# Patient Record
Sex: Female | Born: 1955
Health system: Southern US, Community
[De-identification: ages and names within clinical notes are randomized; demographics above are authoritative.]

---

## 2007-07-31 IMAGING — MG MM DIGITAL SCREENING BILAT
4 series · 4 of 4 positions shown · non-contrast
Comparison: none

DG SCREEN MAMMOGRAM BILATERAL
Bilateral CC and MLO view(s) were taken.

DIGITAL SCREENING MAMMOGRAM WITH CAD:
There are scattered fibroglandular densities.  There is no dominant mass, architectural distortion 
or calcification to suggest malignancy.

[R CC]
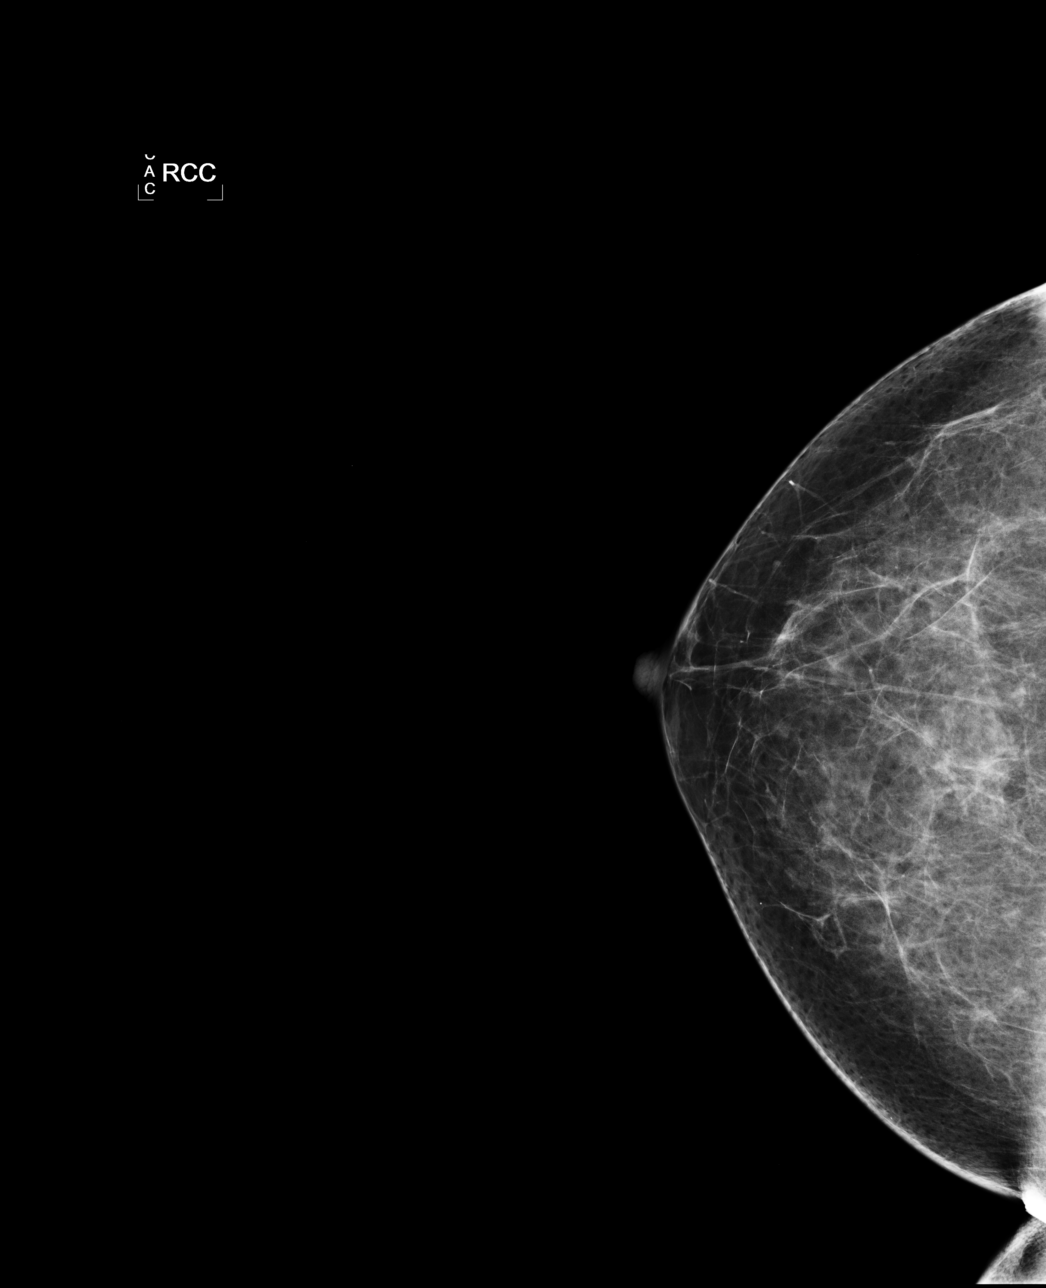

[R MLO]
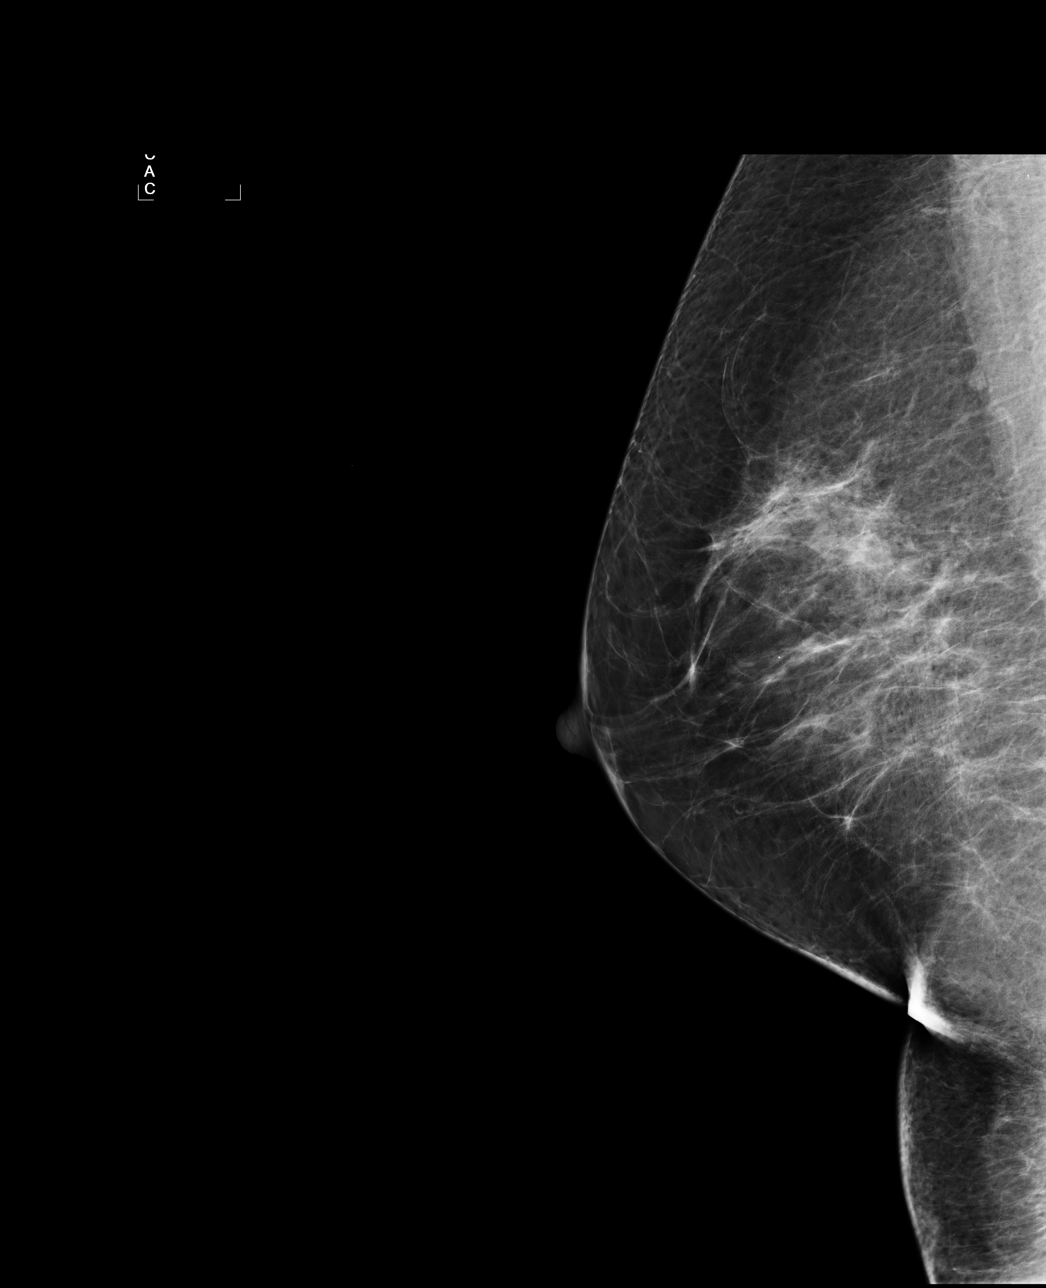

[L CC]
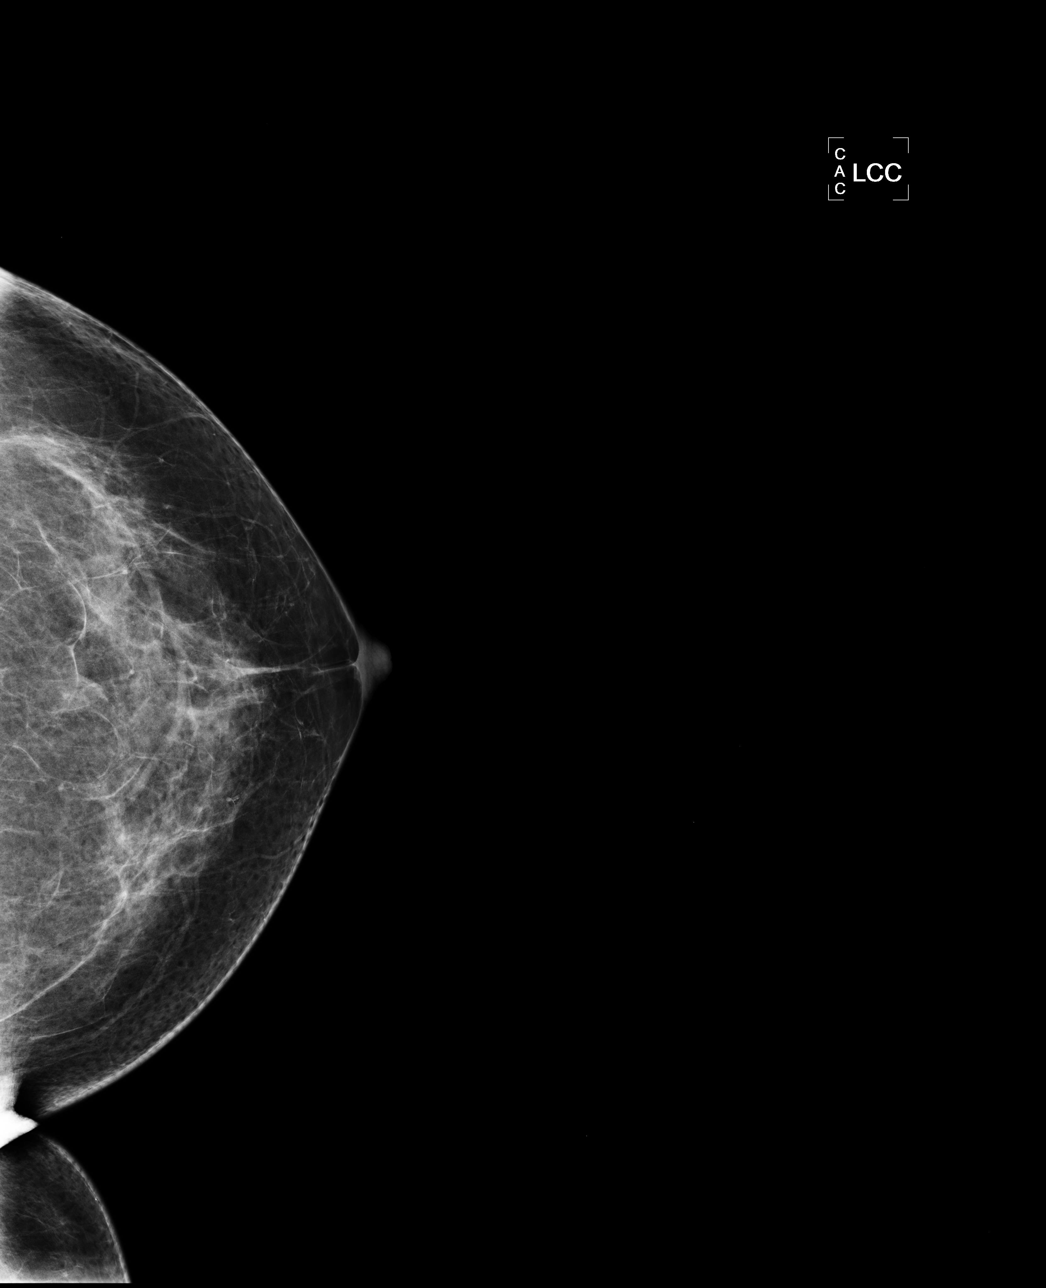

[L MLO]
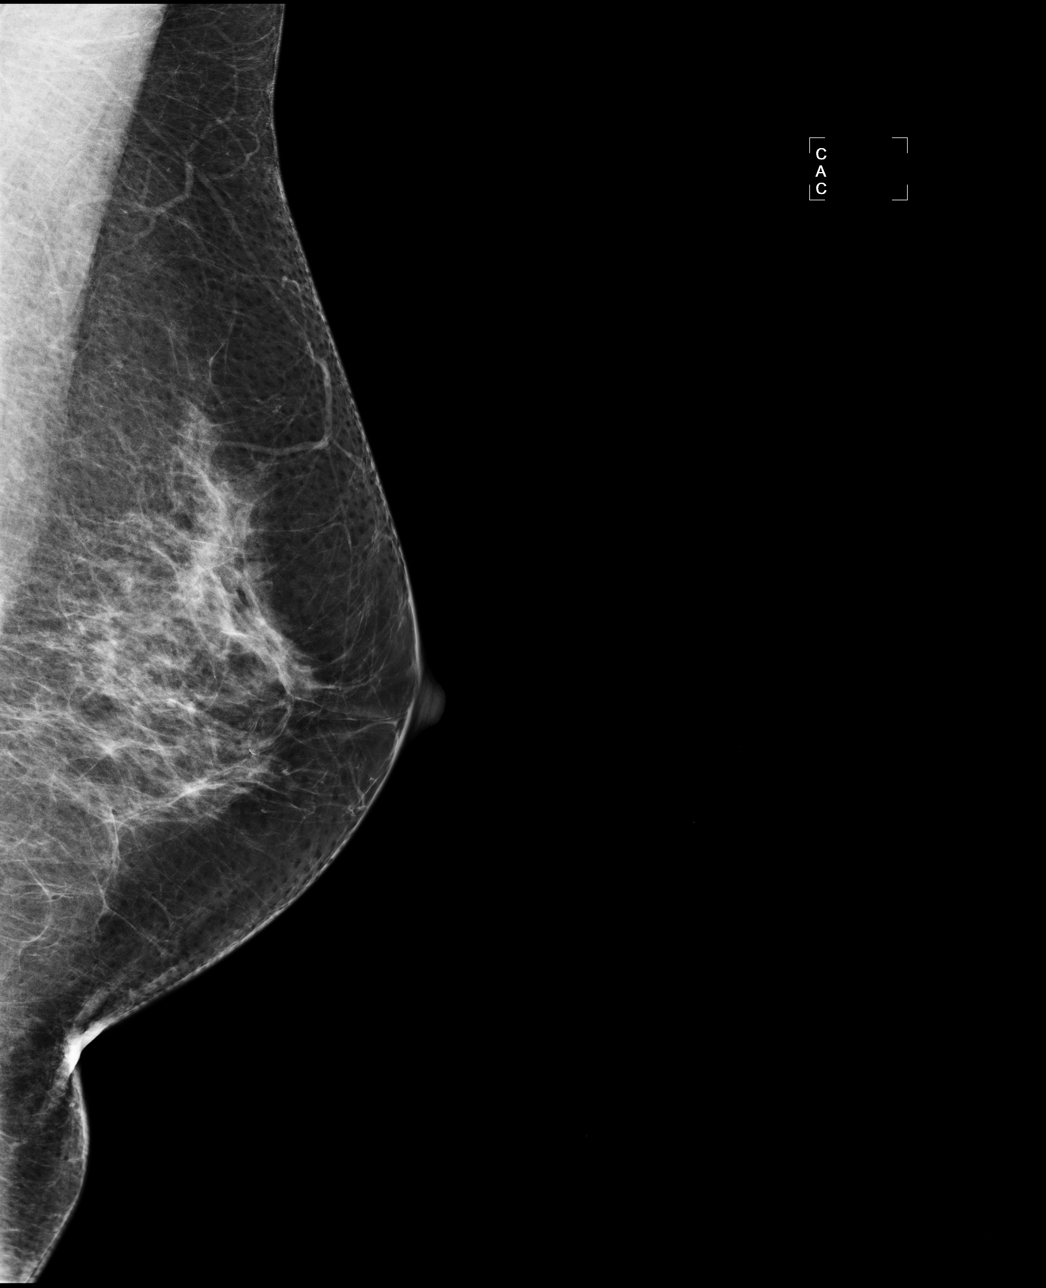

[4 of 4 positions shown; findings below may reference images not displayed]

IMPRESSION: No mammographic evidence of malignancy.  Suggest yearly screening mammography.

ASSESSMENT: Negative - BI-RADS 1

Screening mammogram in 1 year.
ANALYZED BY COMPUTER AIDED DETECTION. , THIS PROCEDURE WAS A DIGITAL MAMMOGRAM.

## 2008-03-20 IMAGING — CR DG CHEST 2V
2 series · 2 of 2 positions shown · non-contrast
Comparison: None

CLINICAL DATA: Flu symptoms, cough, fever.

CHEST - 2 VIEW

[w chest pa]
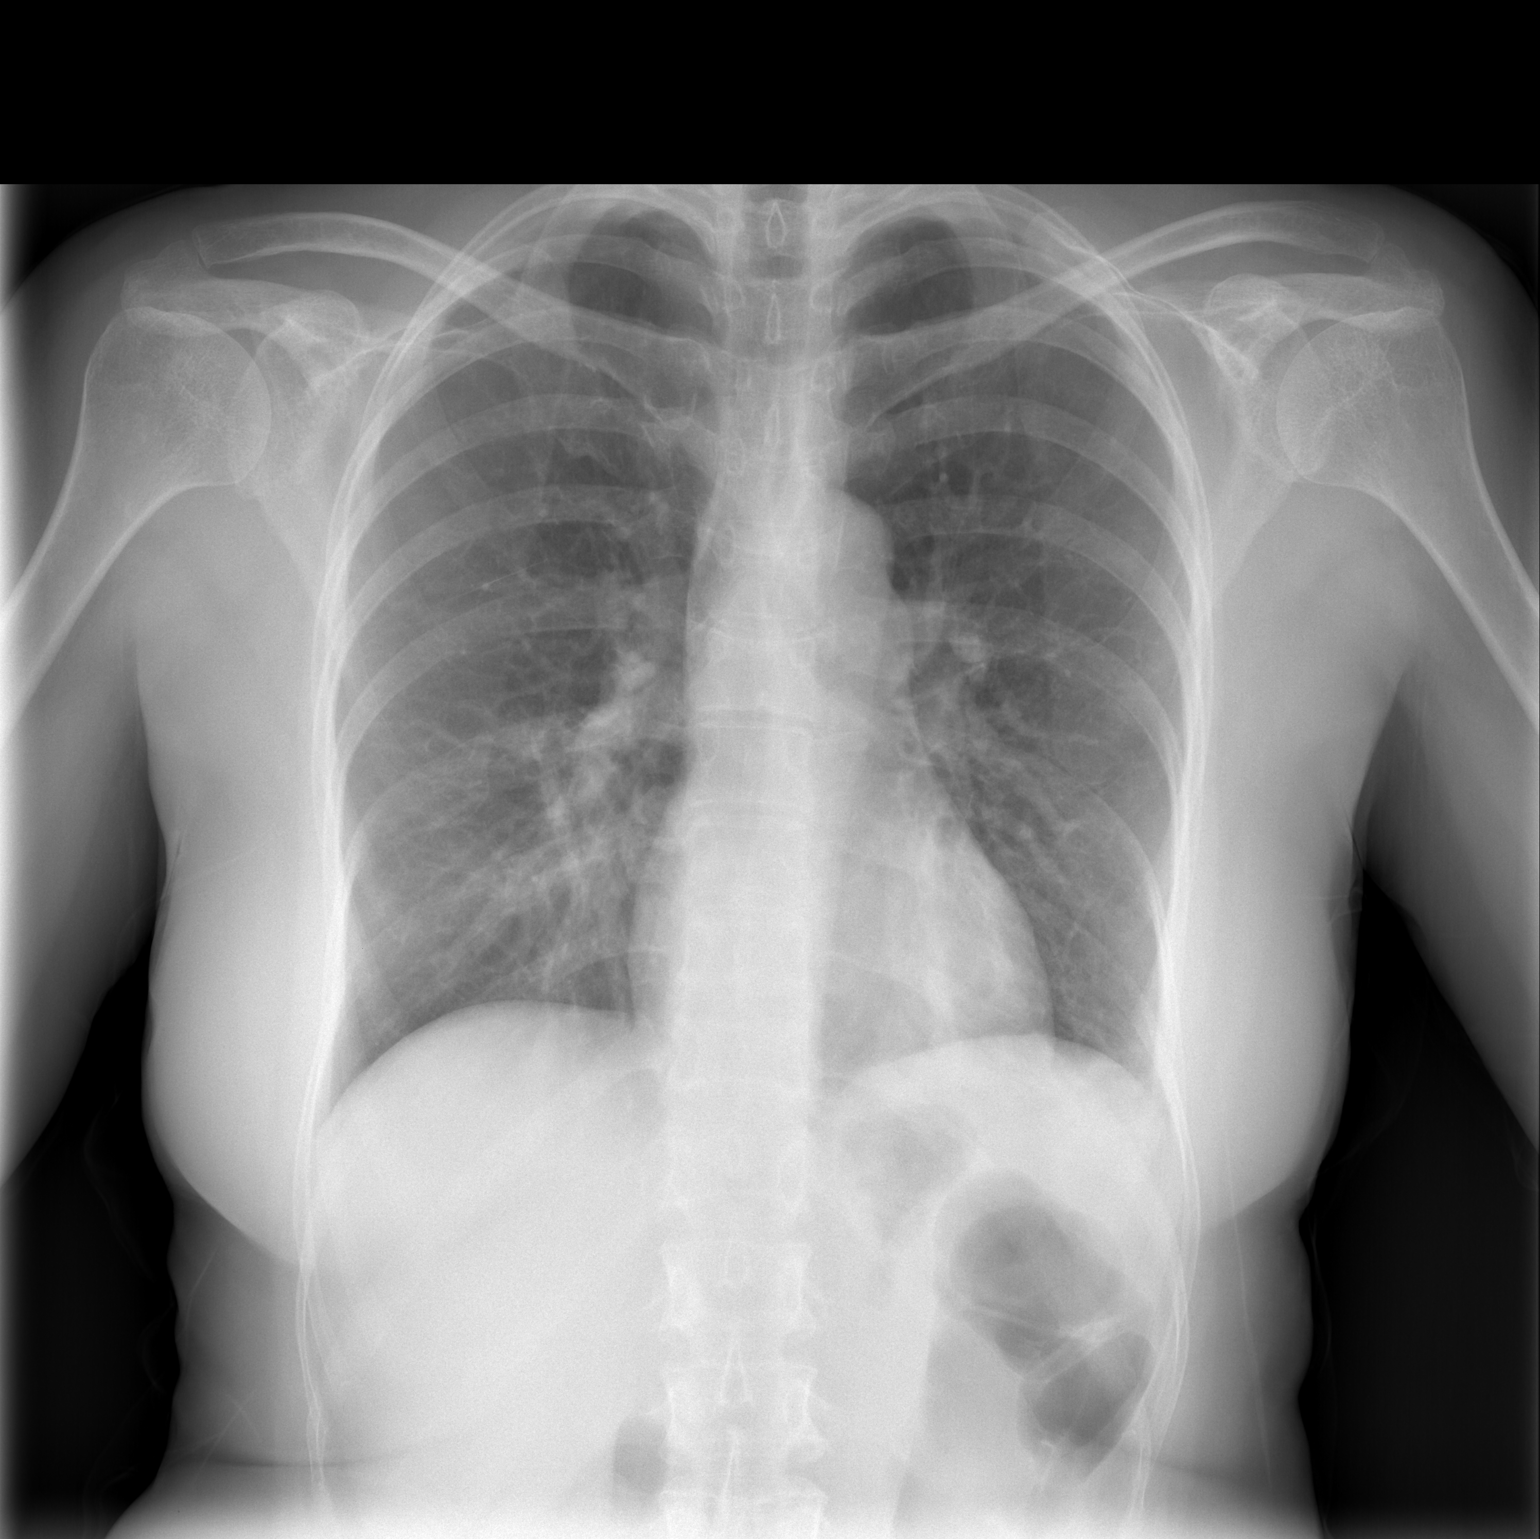

[w chest lat]
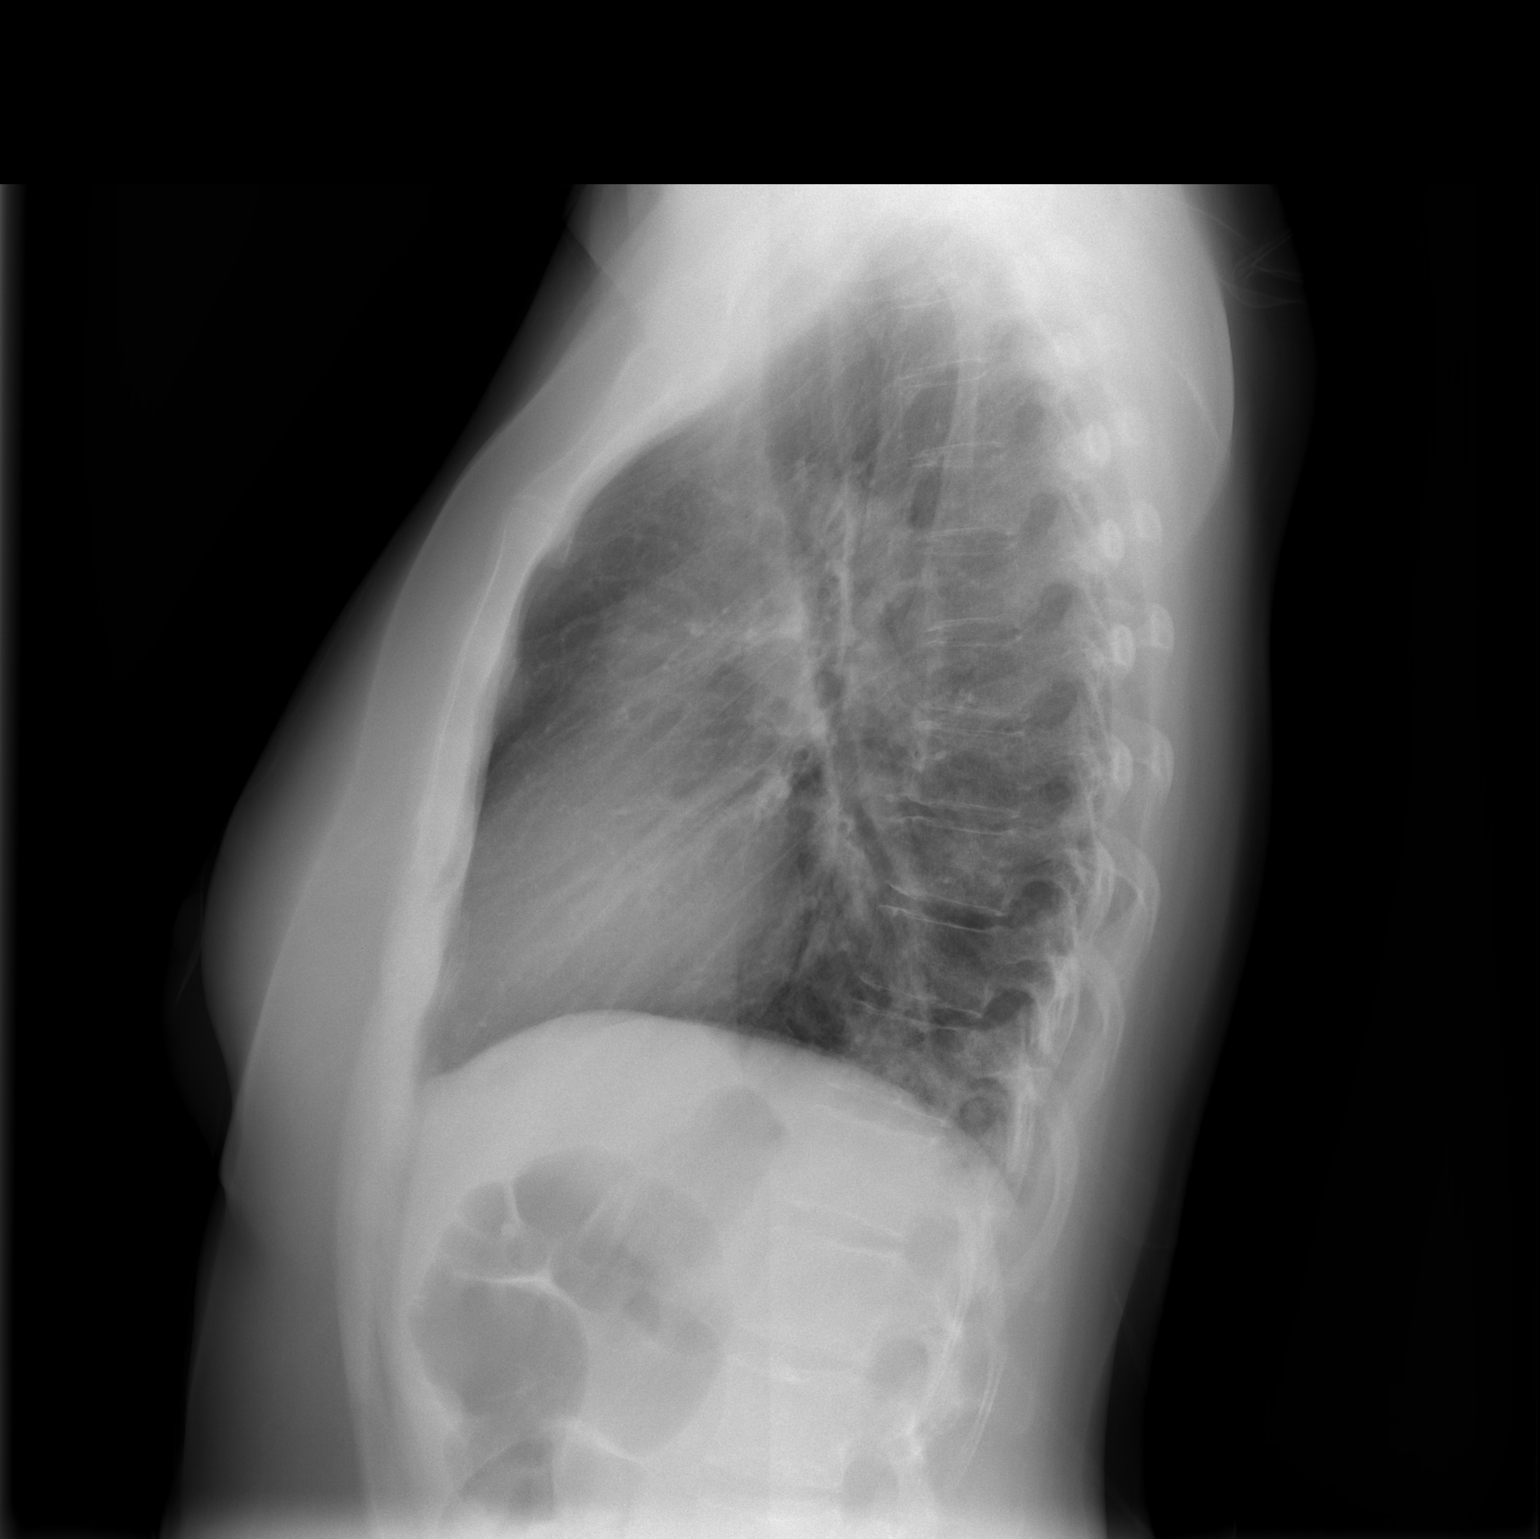

[2 of 2 positions shown; findings below may reference images not displayed]

FINDINGS: There is peribronchial thickening. Heart and mediastinal
contours are within normal limits.  No focal opacities or
effusions.  No acute bony abnormality.
IMPRESSION: Mild bronchitic changes.

## 2008-12-01 IMAGING — CR DG HIP W/ PELVIS BILAT
5 series · 5 of 5 positions shown · non-contrast
Comparison: None

CLINICAL DATA: History given of bilateral hip pain left more than
right.  History of osteopenia.

BILATERAL HIP WITH PELVIS - 4+ VIEW

[t pelvis a.p.]
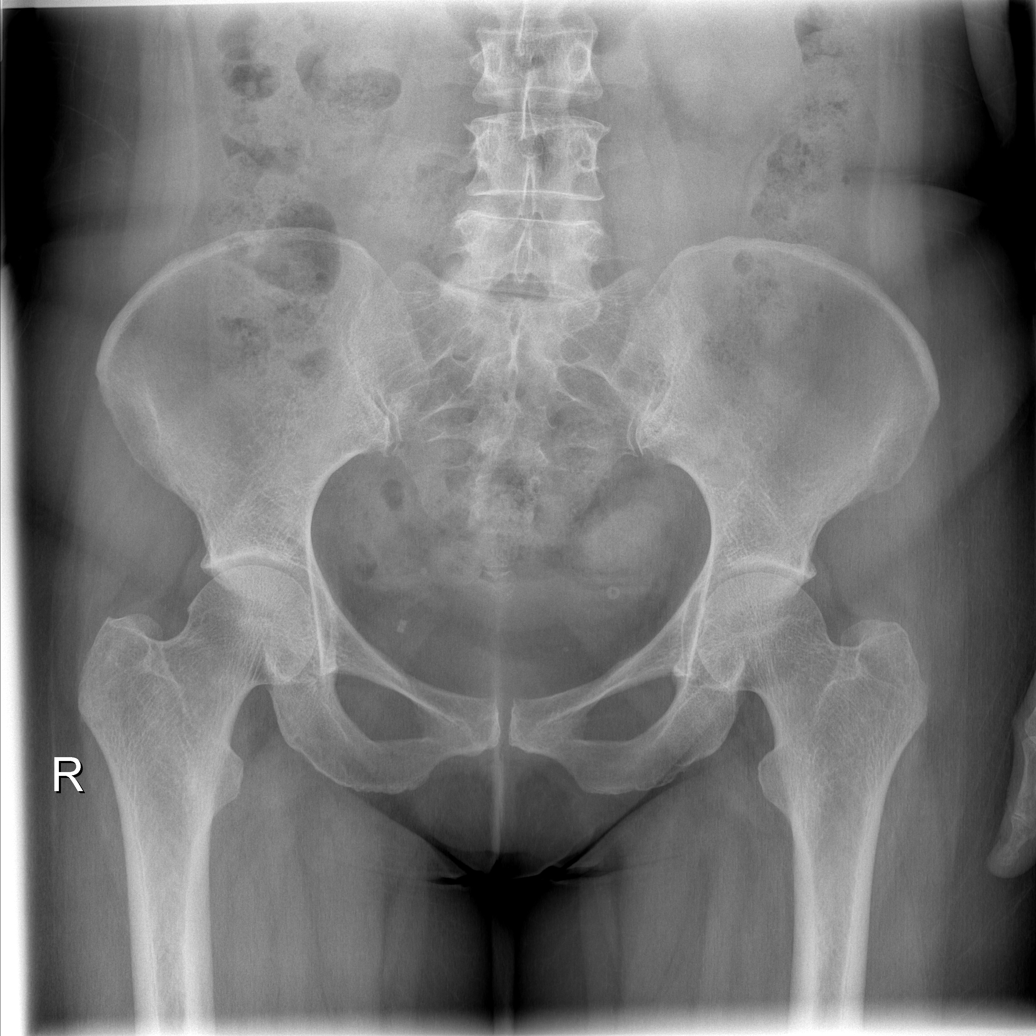

[t hip ap left]
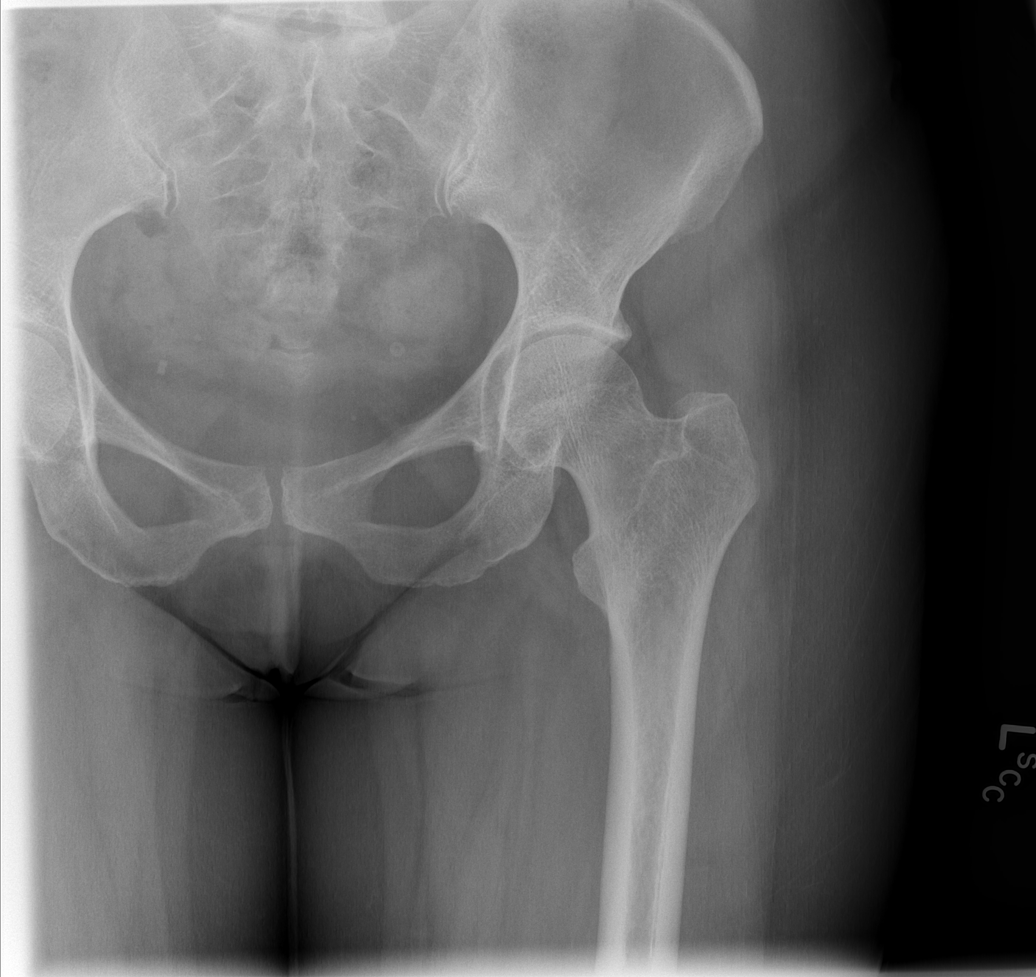

[t hip frog leg left]
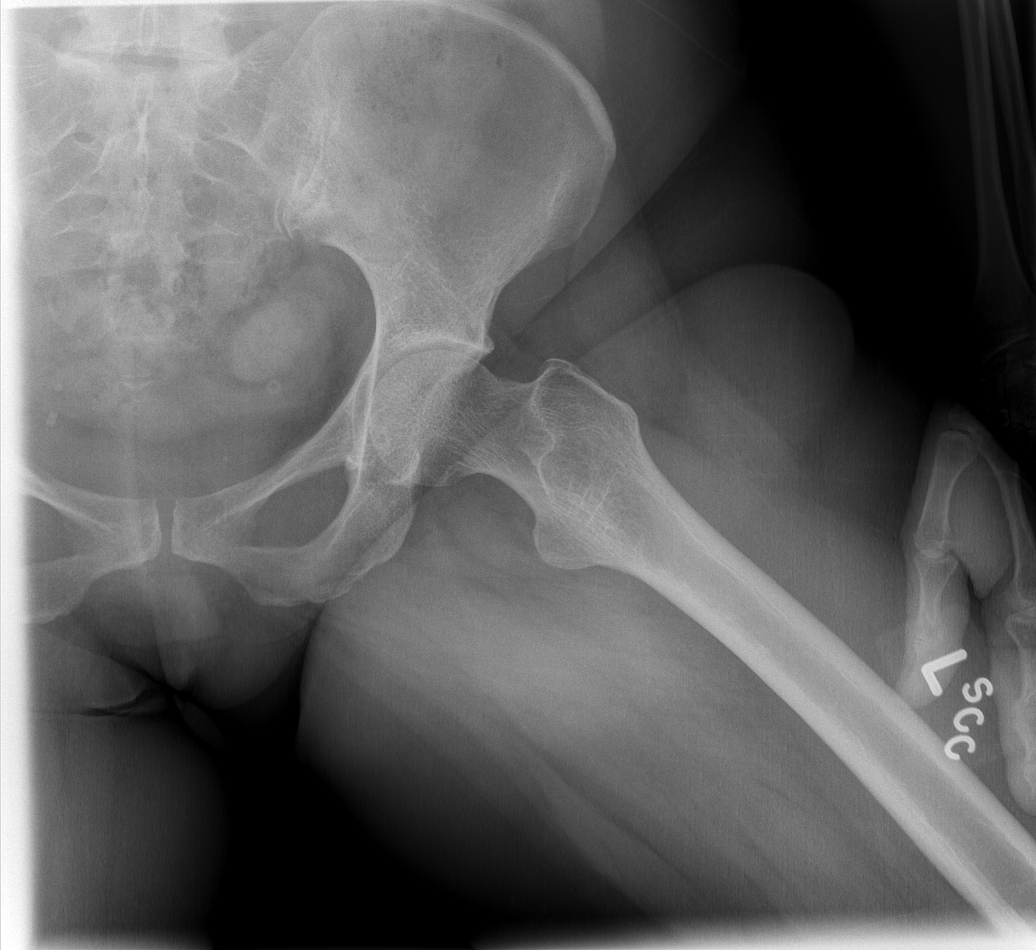

[t hip ap right]
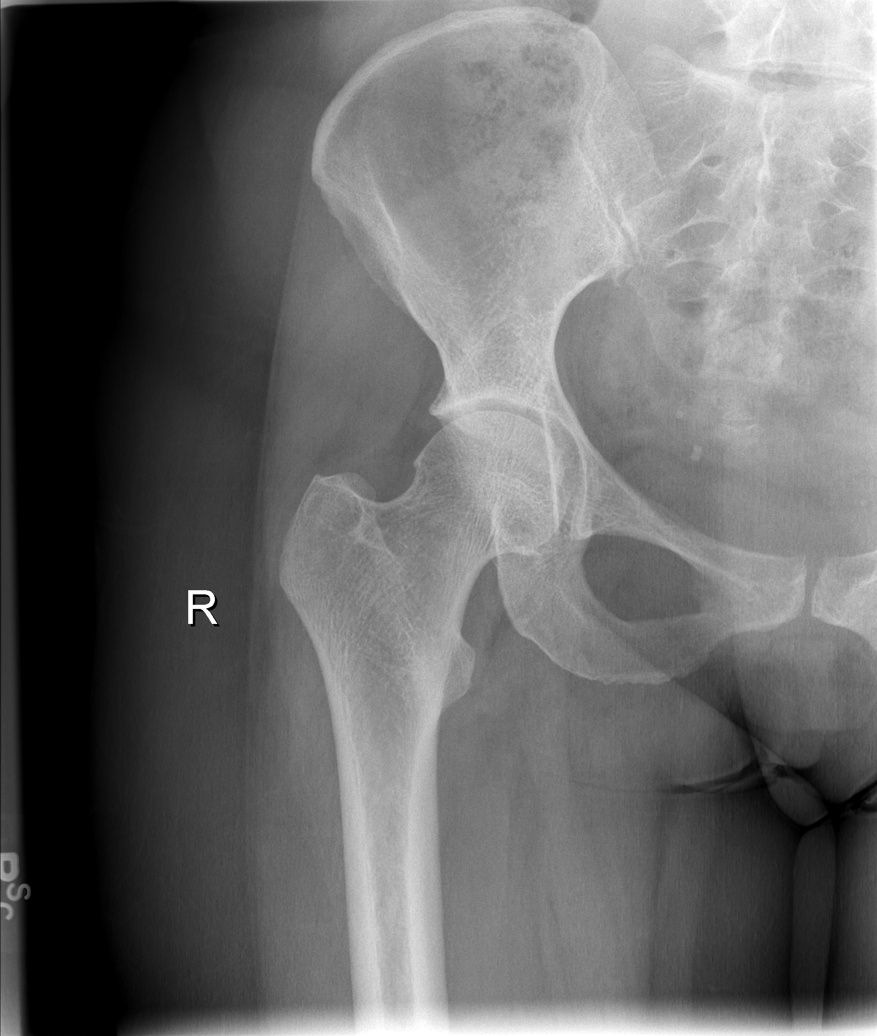

[t hip frog leg right]
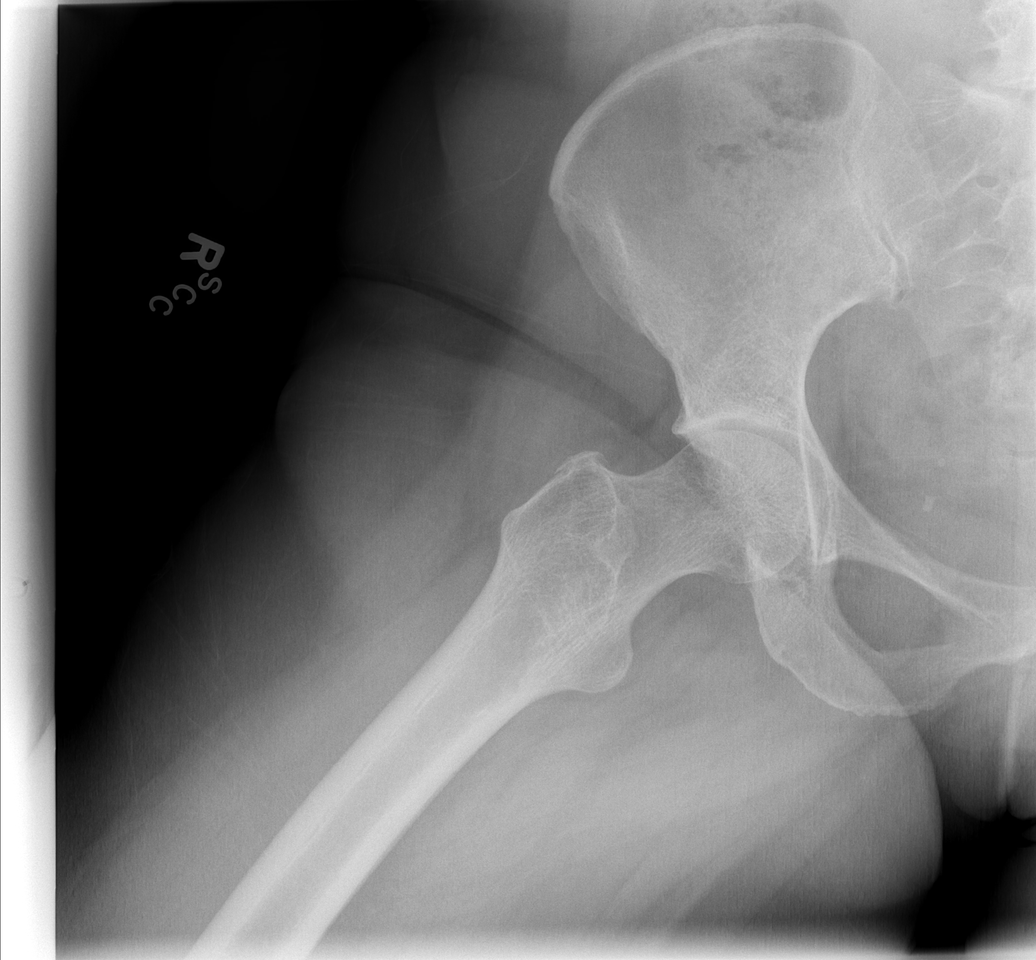

[5 of 5 positions shown; findings below may reference images not displayed]

FINDINGS: There is mild degenerative spondylosis compatible with
age. Tubal ligation clips are seen in the pelvis.  SI joints appear
intact.  Hip joint spaces are preserved.  No fracture, dislocation,
bony destruction, or calcific bursitis is identified.  There is
very slight minimal degenerative spurring of the right femoral
head.
IMPRESSION: There is mild degenerative spondylosis compatible with age. There
is very slight minimal degenerative spurring of right femoral head.
No other hip abnormality is identified.

## 2010-01-11 IMAGING — CR DG SHOULDER 2+V*L*
3 series · 3 of 3 positions shown · non-contrast
Comparison: None.

CLINICAL DATA: Left shoulder pain

LEFT SHOULDER - 2+ VIEW

[w shoulder ap internal left]
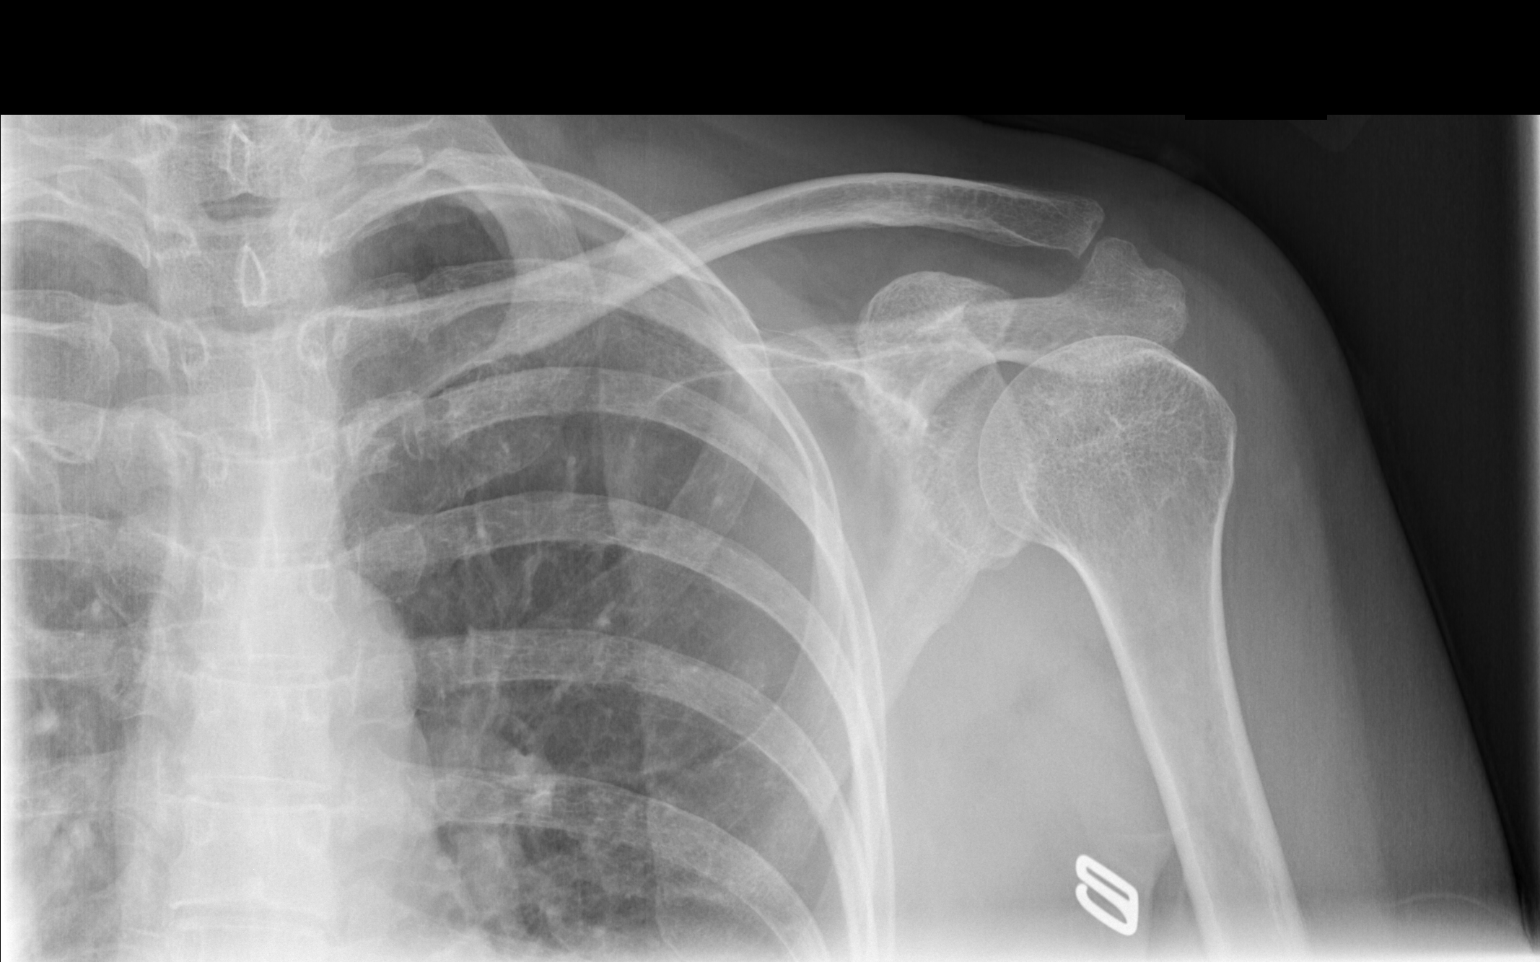

[w shoulder ap external left]
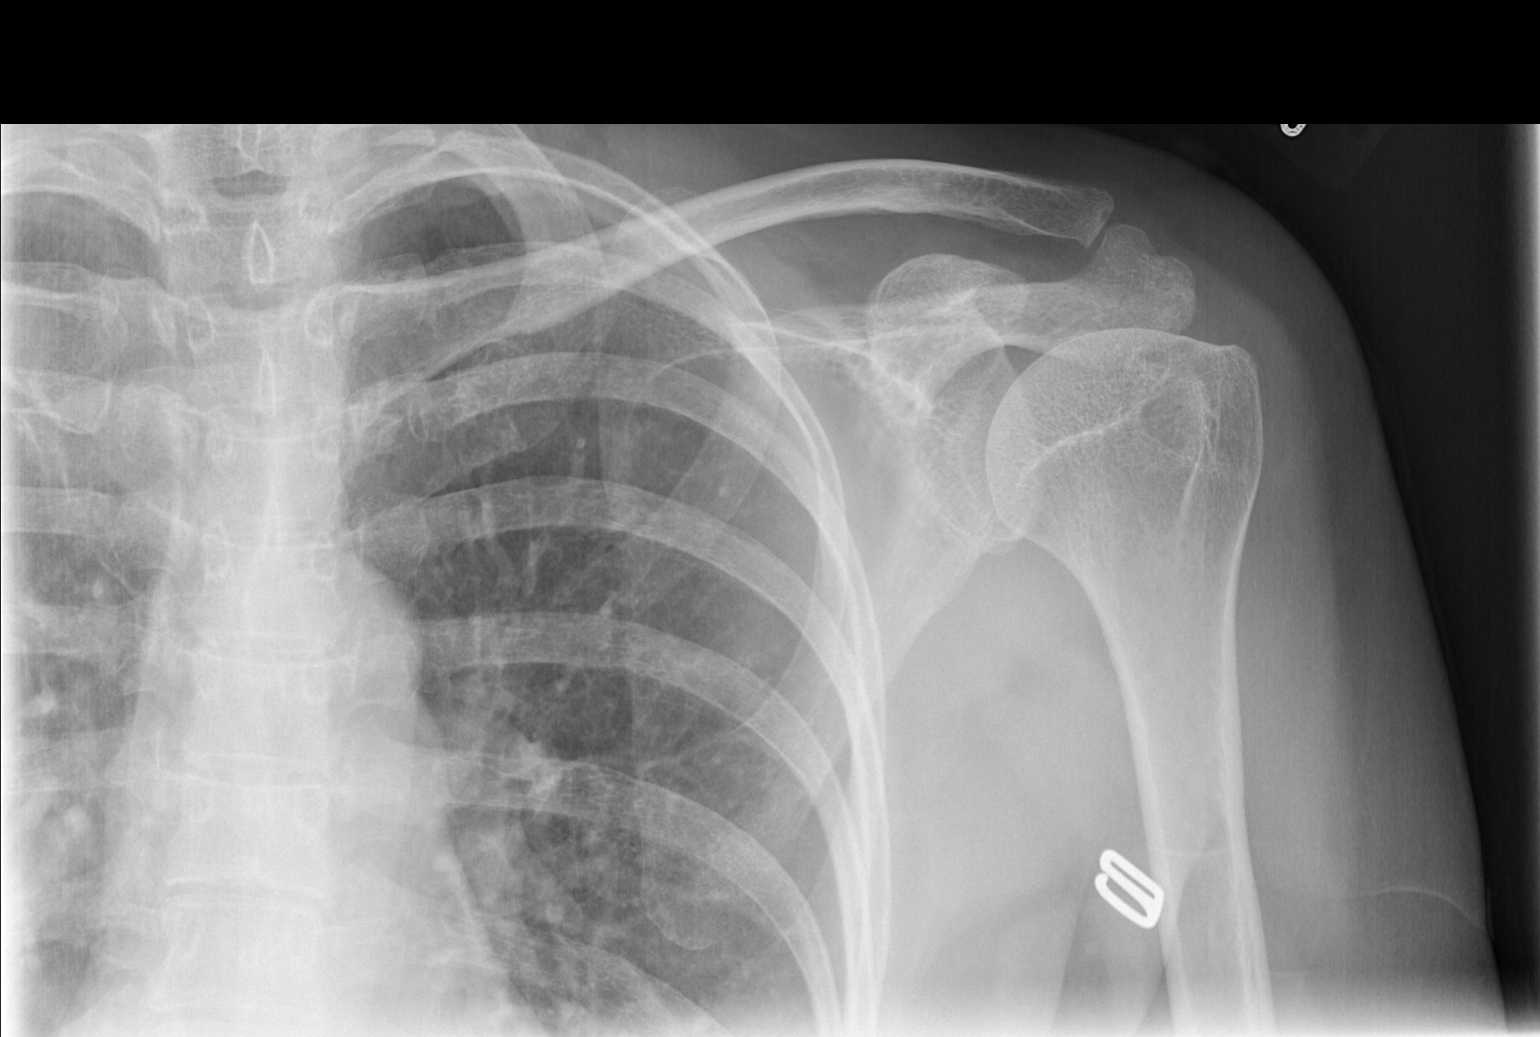

[w shoulder y view left]
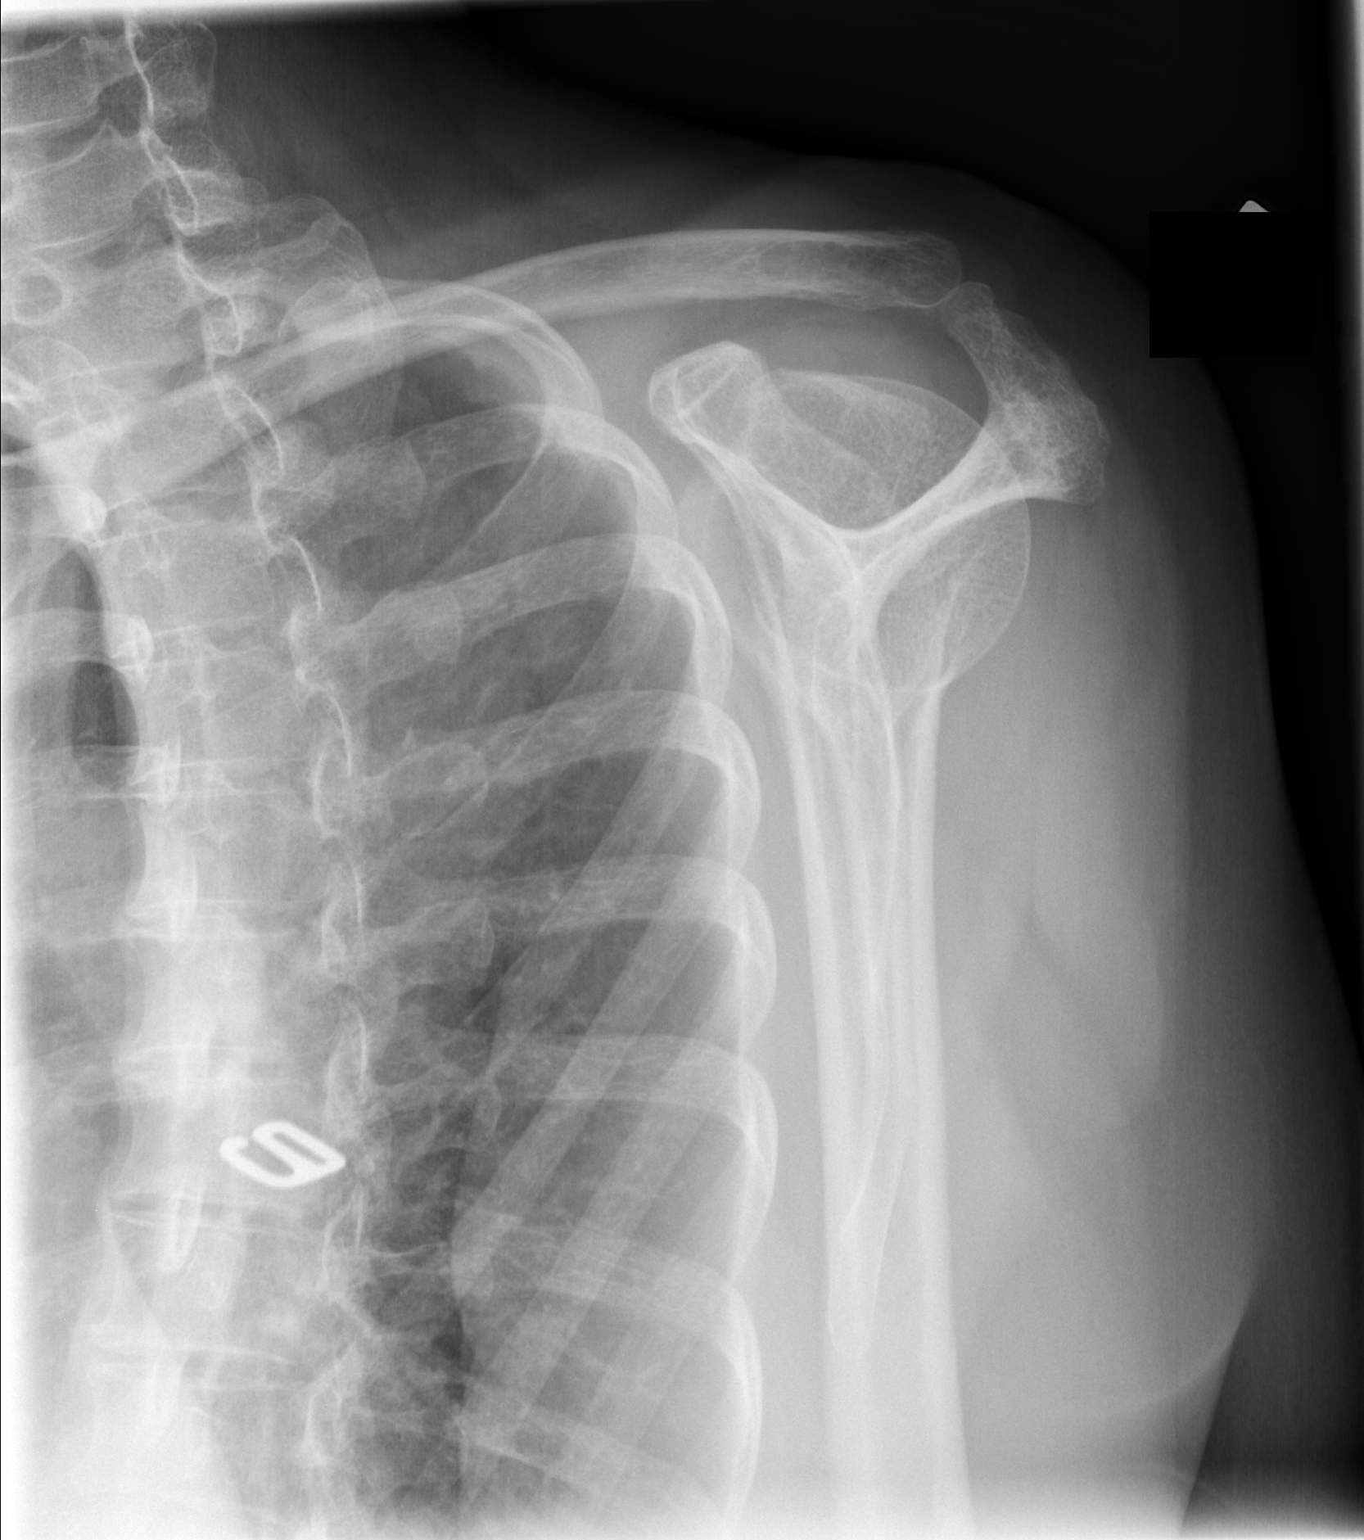

[3 of 3 positions shown; findings below may reference images not displayed]

FINDINGS: The gleno humeral joint is intact.  No evidence of
fracture.  The acromioclavicular joint is normal.  No significant
arthropathy is noted.
IMPRESSION: 1.  No bony abnormality in the left shoulder.
2.  No radiographic evidence of rotator cuff injury.  Consider MRI
for further evaluation.

## 2011-01-31 IMAGING — MG MM DIGITAL SCREENING {WH}
5 series · 5 of 5 positions shown · non-contrast
Comparison: none

DG SCREEN MAMMOGRAM BILATERAL
Bilateral CC and MLO view(s) were taken.

DIGITAL SCREENING MAMMOGRAM WITH CAD:
The breast tissue is heterogeneously dense.  No masses or malignant type calcifications are 
identified.  Compared with prior studies.
Images were processed with CAD.

[R CC]
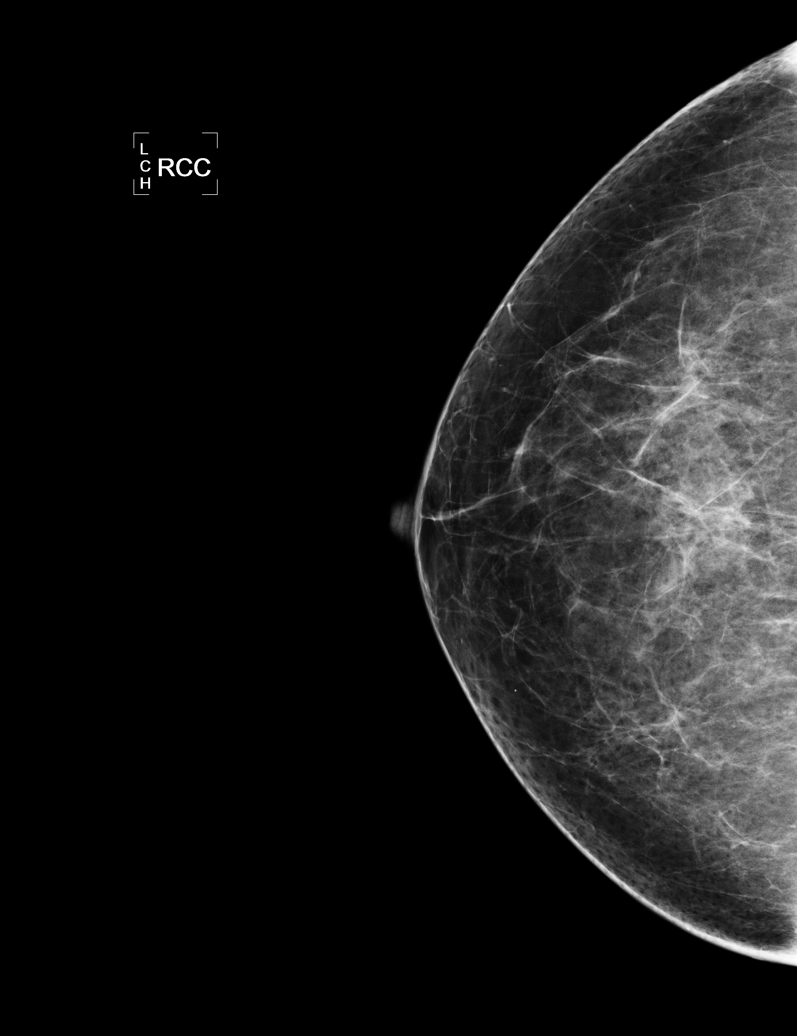

[R MLO]
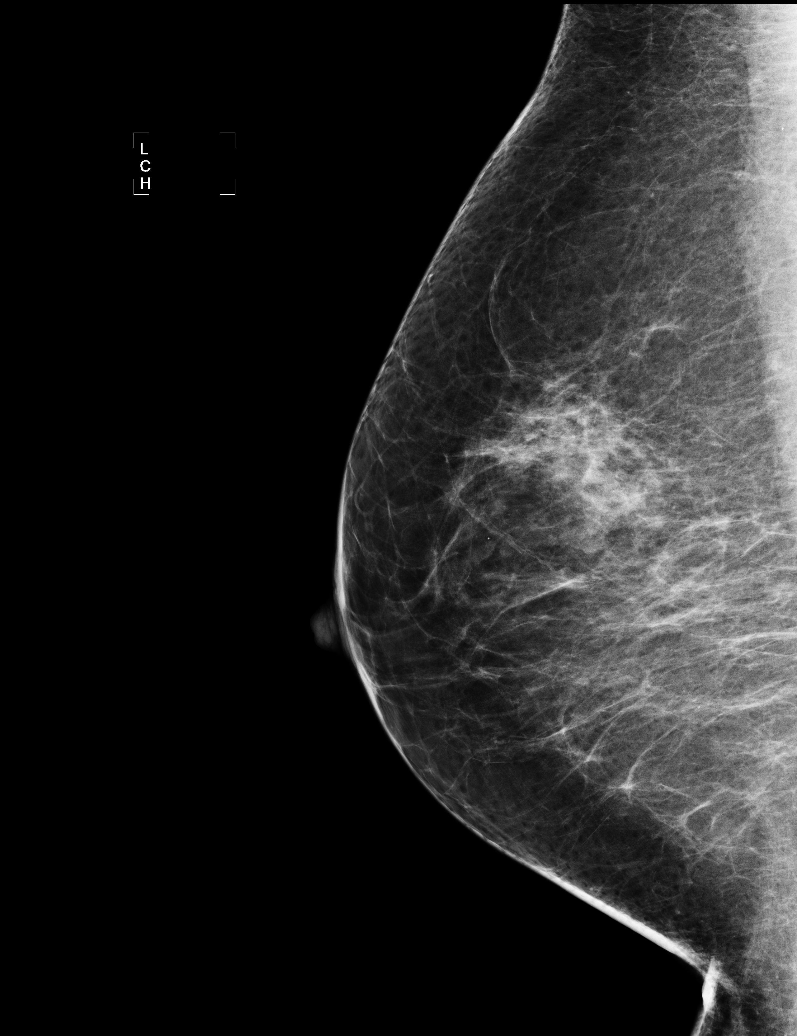

[L CC (1 of 2)]
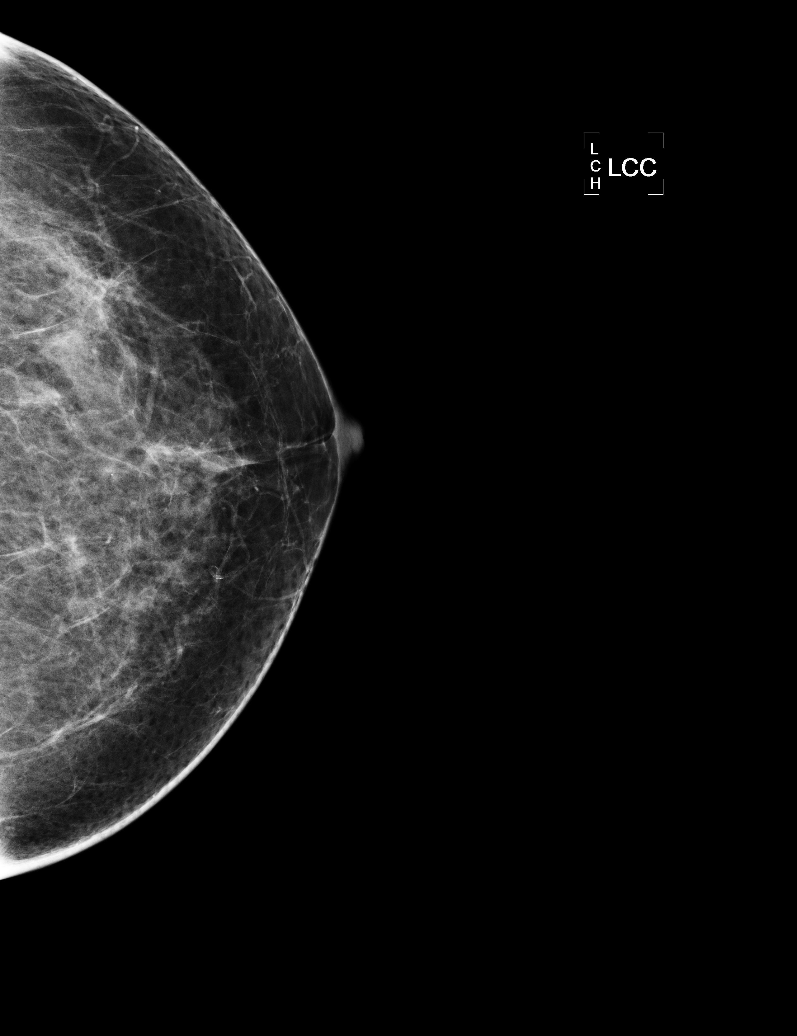

[L MLO]
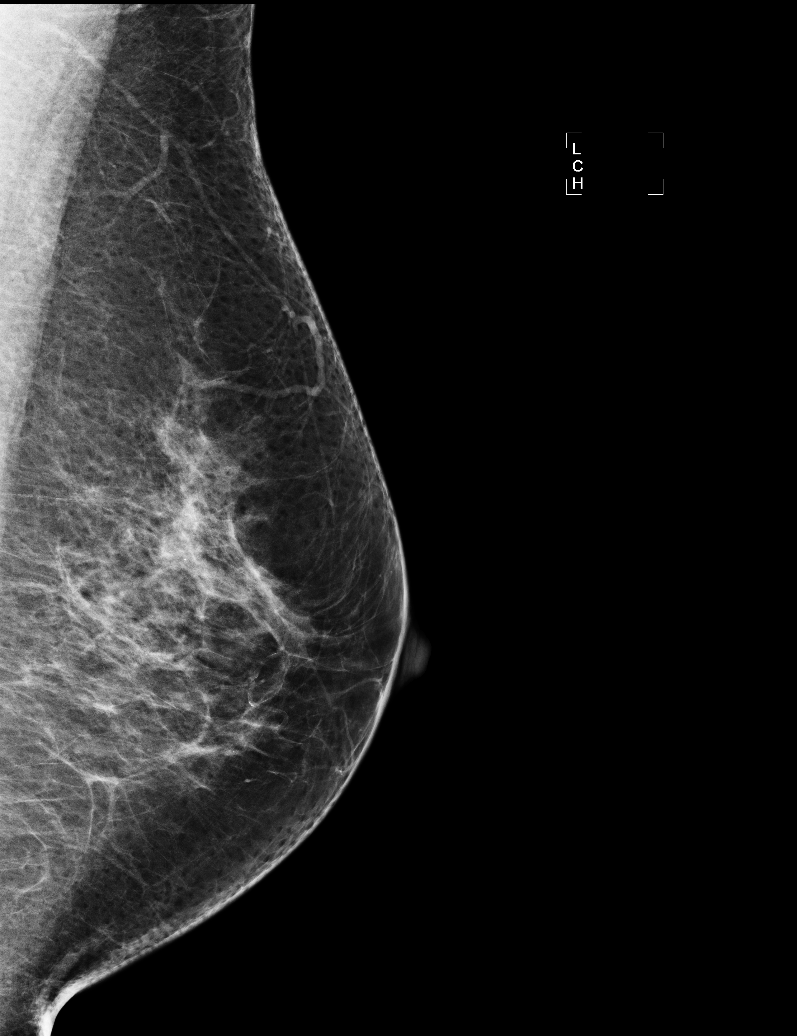

[L CC (2 of 2)]
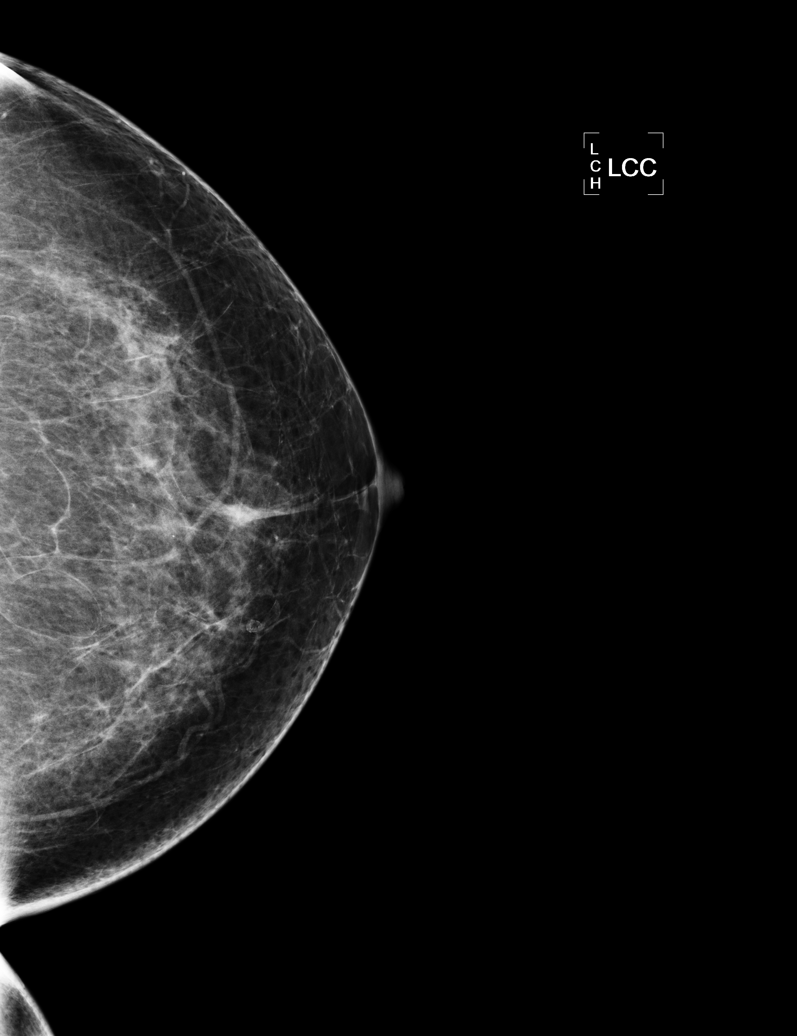

[5 of 5 positions shown; findings below may reference images not displayed]

IMPRESSION: No specific mammographic evidence of malignancy.  Next screening mammogram is recommended in one 
year.

A result letter of this screening mammogram will be mailed directly to the patient.

ASSESSMENT: Negative - BI-RADS 1

Screening mammogram in 1 year.
,

## 2013-06-07 IMAGING — MG MM SCREEN MAMMOGRAM BILATERAL
4 series · 4 of 4 positions shown · non-contrast
Comparison: [DATE], [DATE]

CLINICAL DATA: Screening.

EXAM:
DIGITAL SCREENING BILATERAL MAMMOGRAM WITH CAD

[R CC]
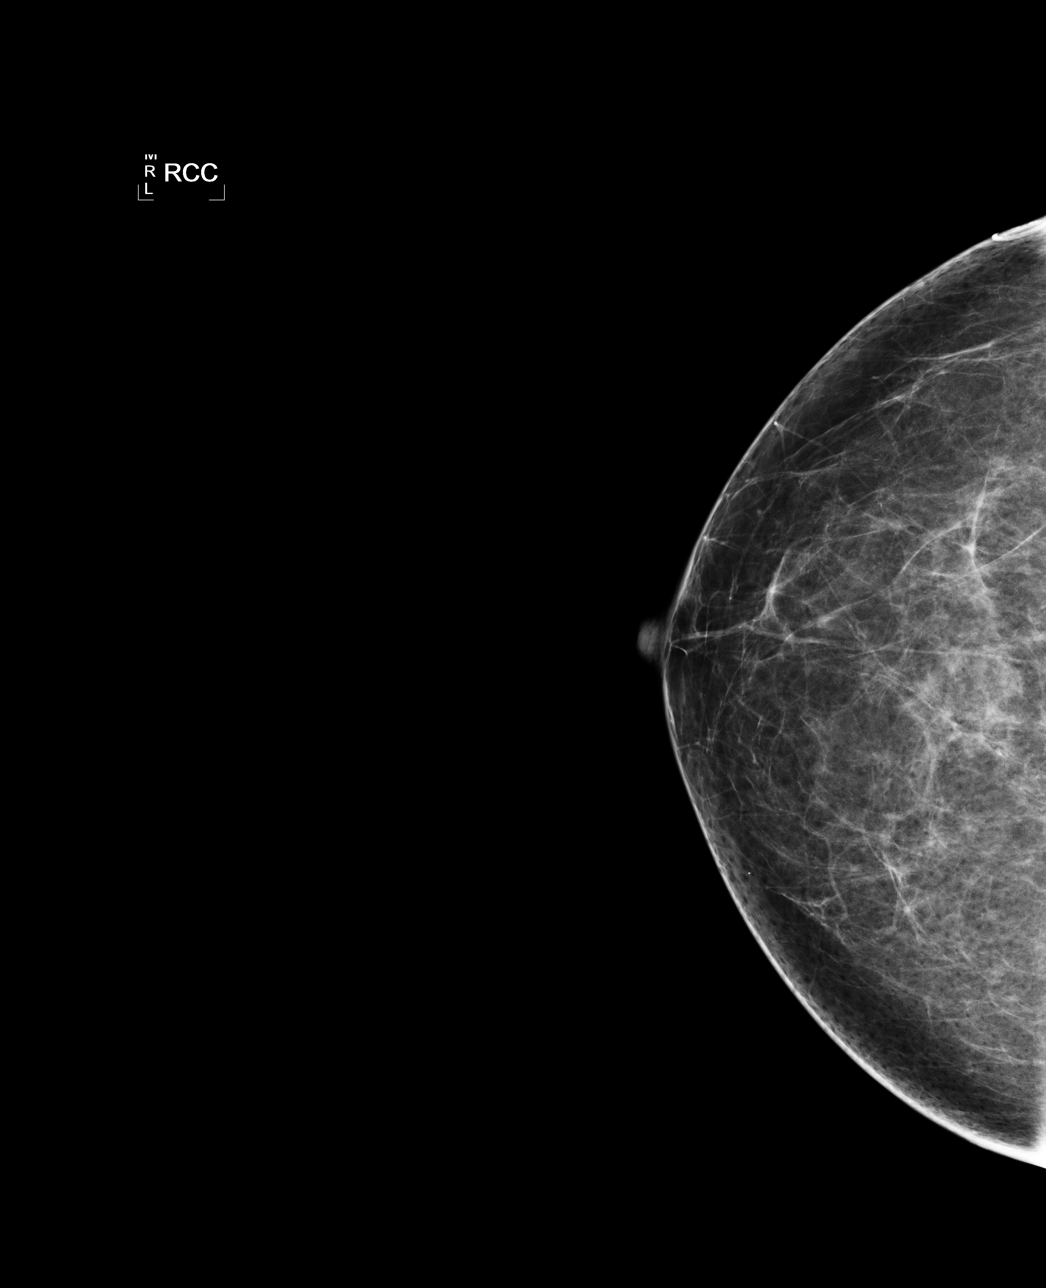

[L CC]
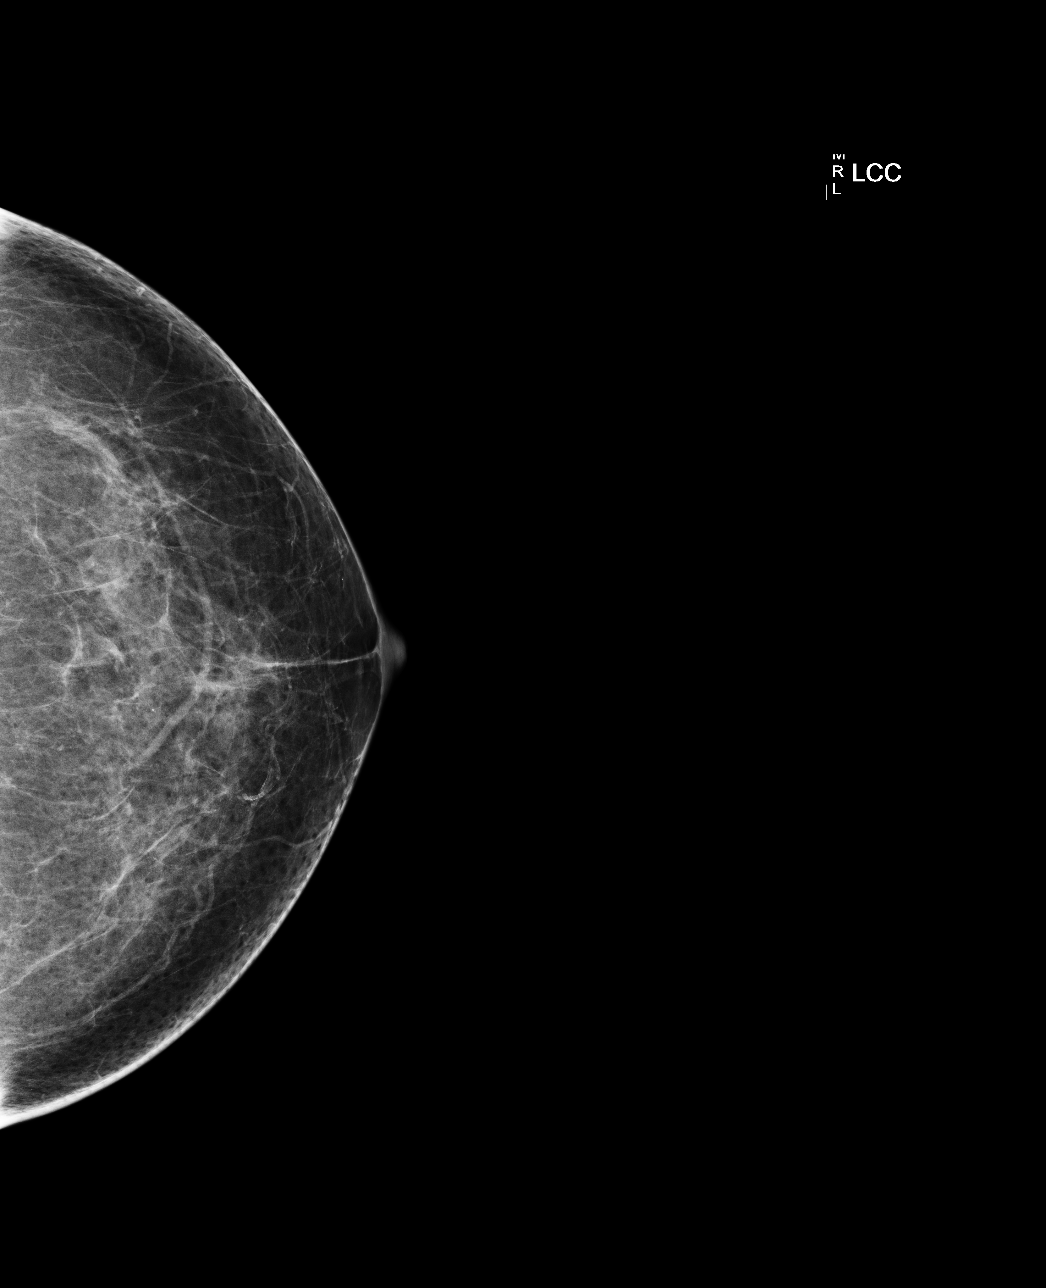

[L MLO]
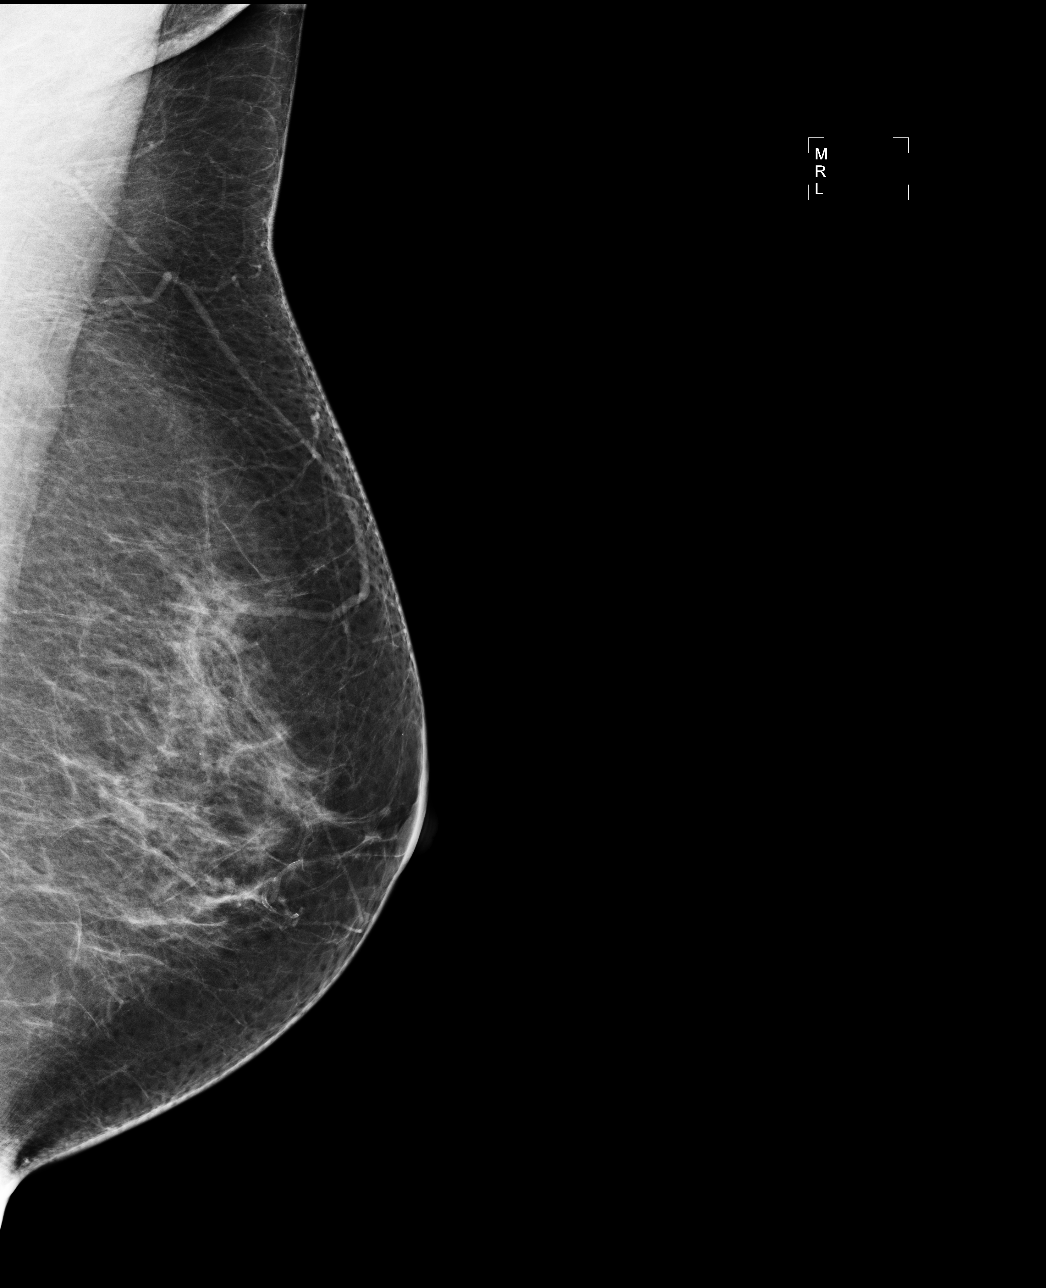

[R MLO]
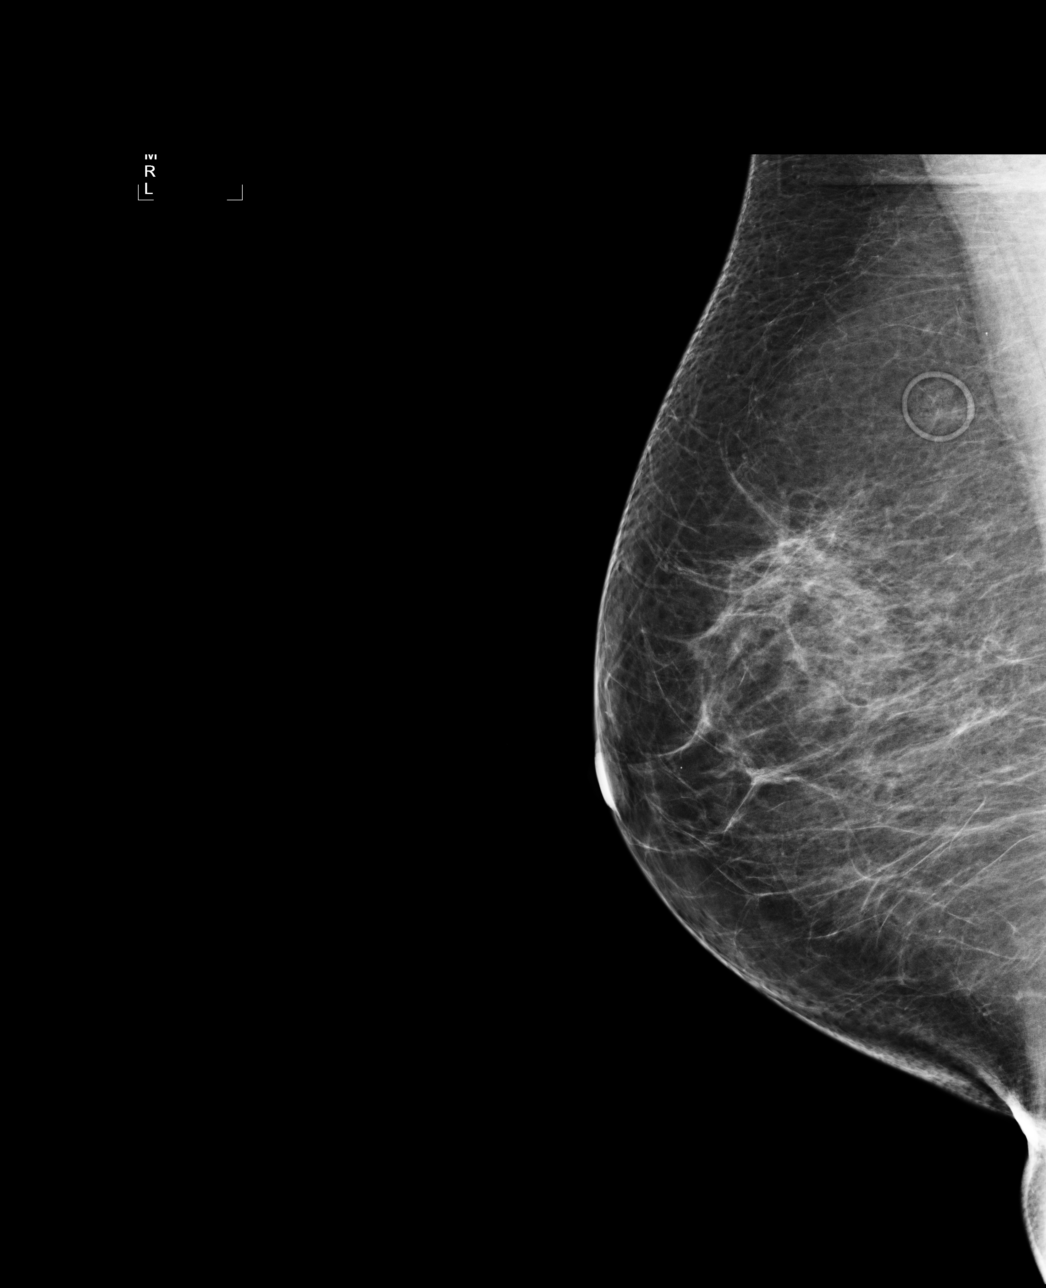

[4 of 4 positions shown; findings below may reference images not displayed]

ACR Breast Density Category b: There are scattered areas of
fibroglandular density.
FINDINGS: There are no findings suspicious for malignancy. Images were
processed with CAD.
IMPRESSION: No mammographic evidence of malignancy. A result letter of this
screening mammogram will be mailed directly to the patient.

RECOMMENDATION:
Screening mammogram in one year. (Code:[3K])

BI-RADS CATEGORY  1: Negative

## 2014-02-16 IMAGING — US US ABDOMEN COMPLETE
1 series · 14 of 25 positions shown · non-contrast
Comparison: None.

CLINICAL DATA: Abdominal pain

EXAM:
ULTRASOUND ABDOMEN COMPLETE

[Series 1: us abdomen complete · 0.27mm/px · 14 of 81 slices shown]
[im 1/81]
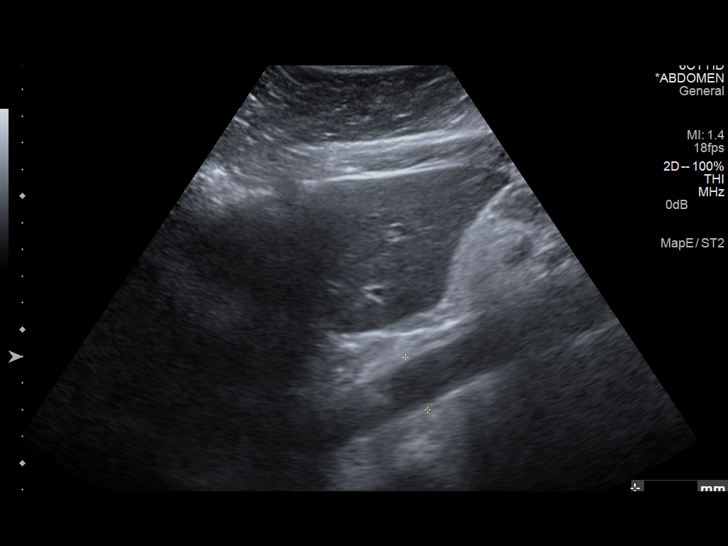
[im 7/81]
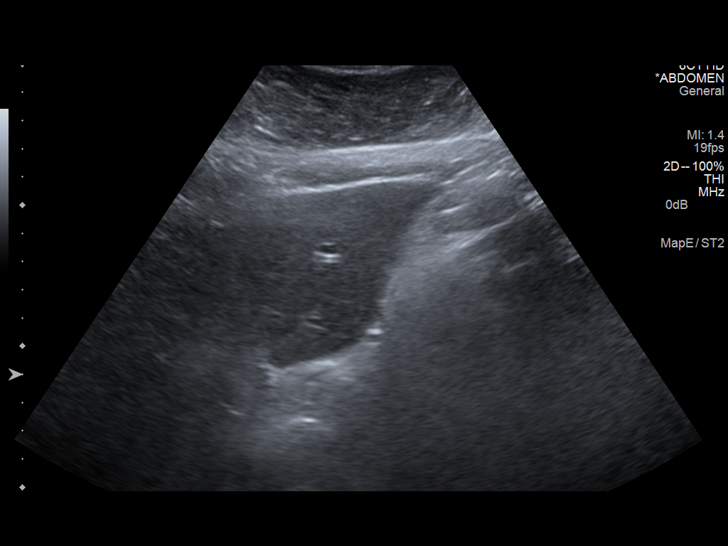
[im 14/81]
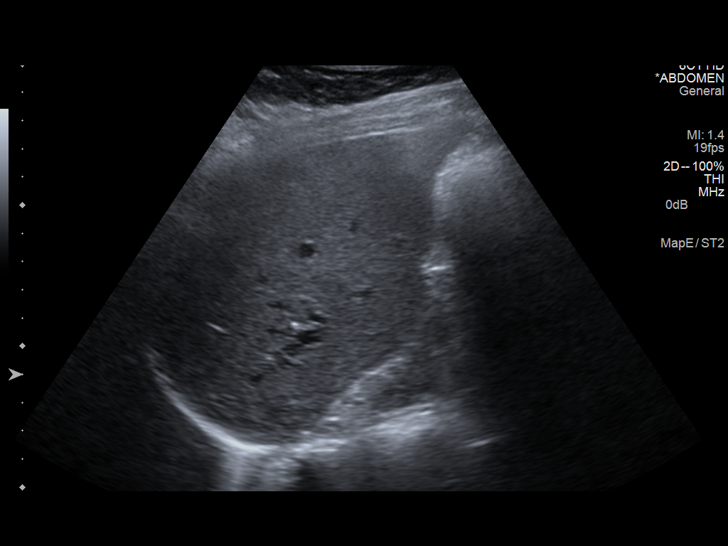
[im 21/81]
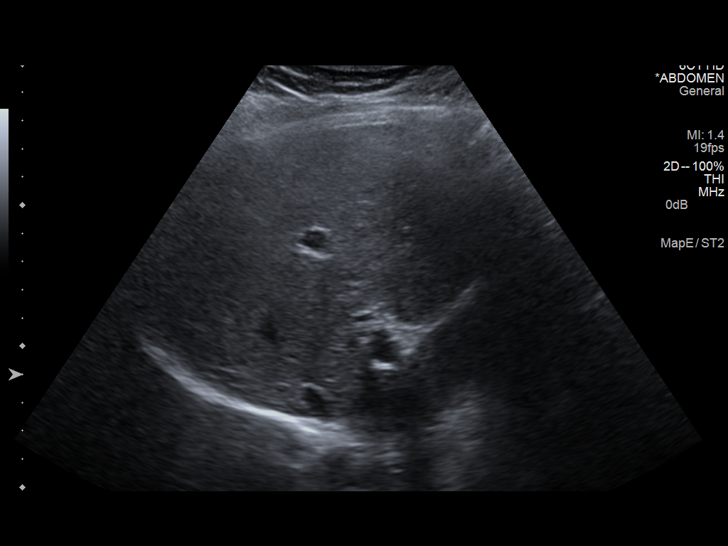
[im 27/81]
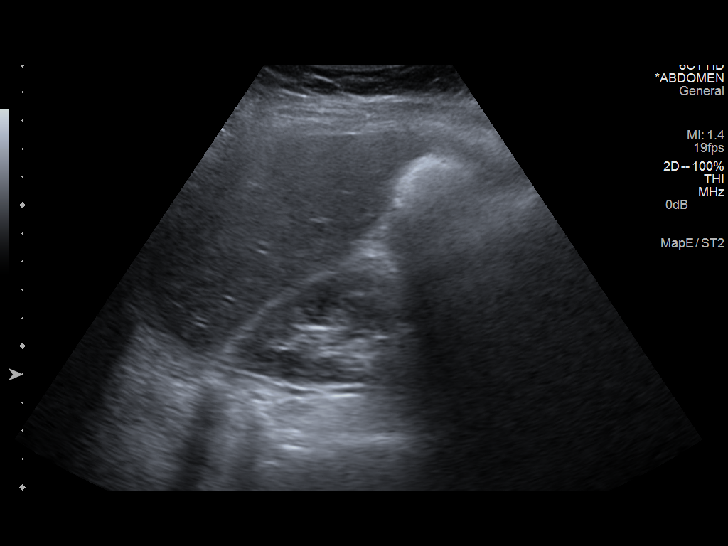
[im 31/81]
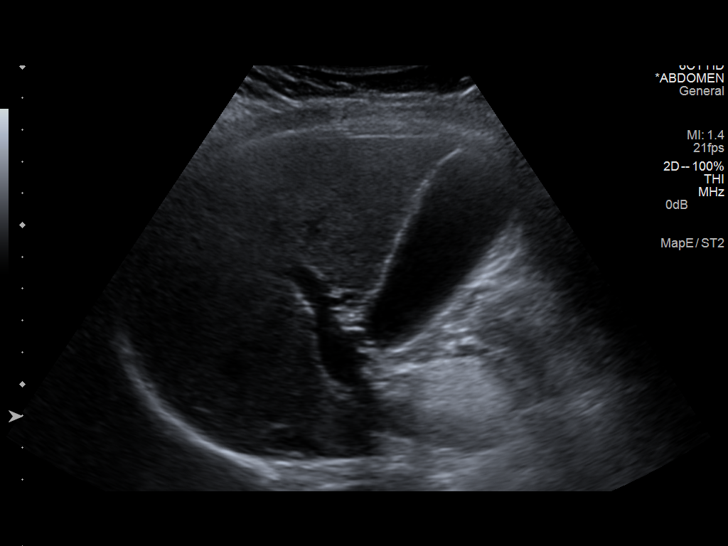
[im 37/81]
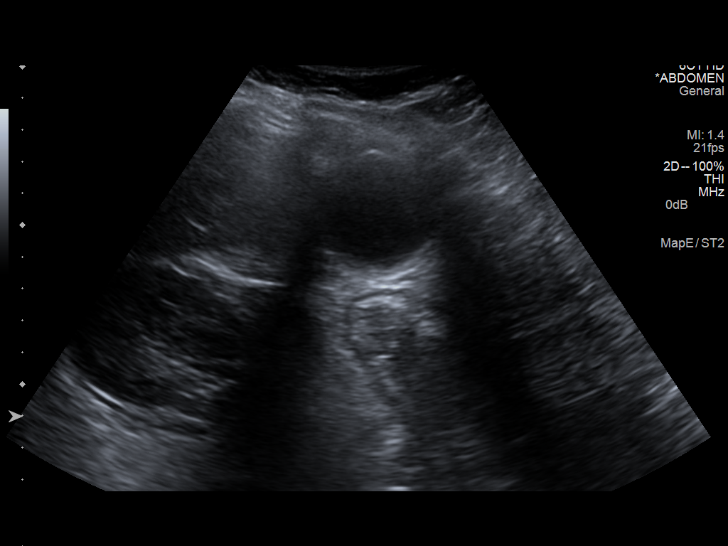
[im 44/81]
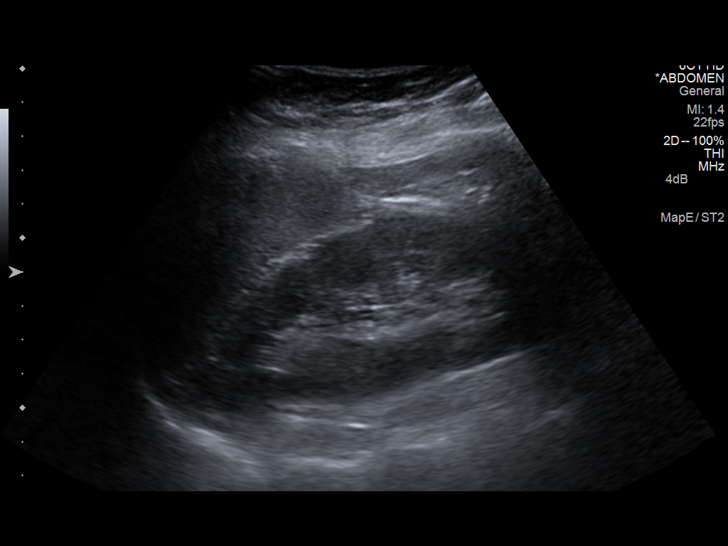
[im 51/81]
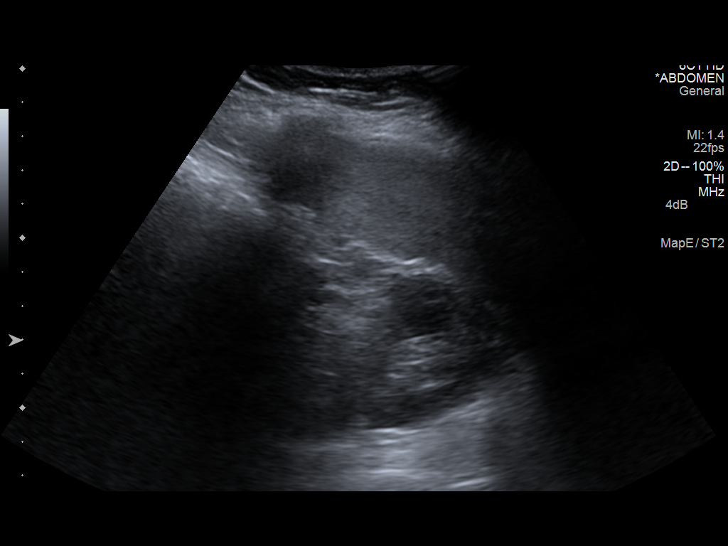
[im 54/81]
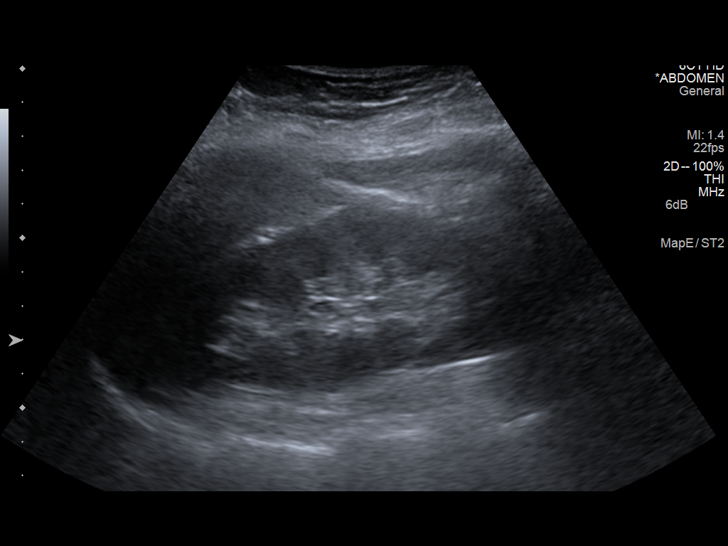
[im 61/81]
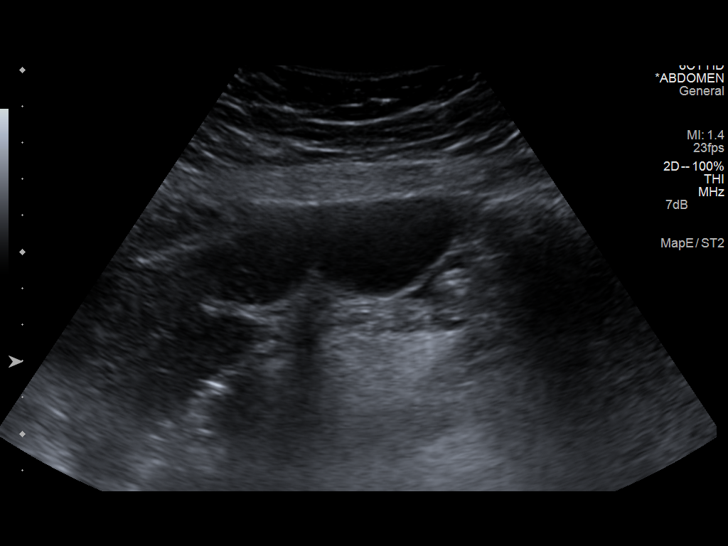
[im 67/81]
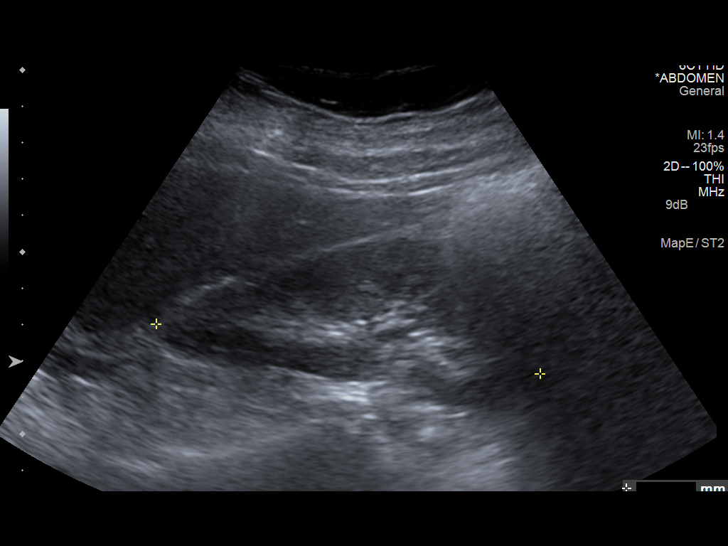
[im 74/81]
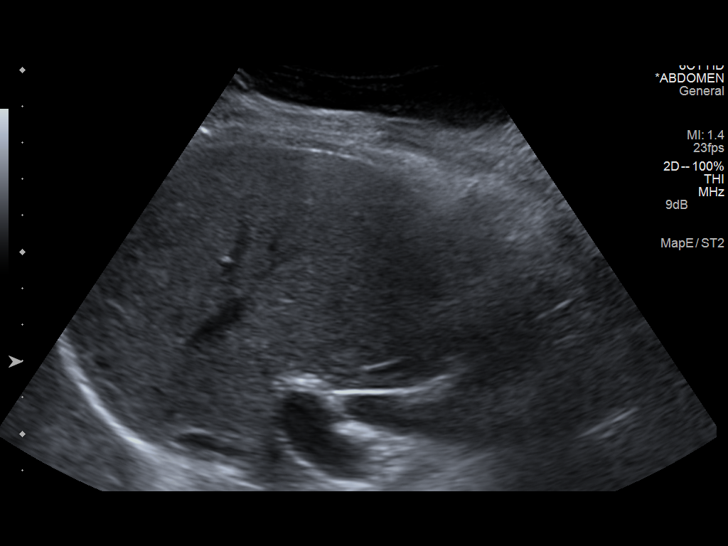
[im 81/81]
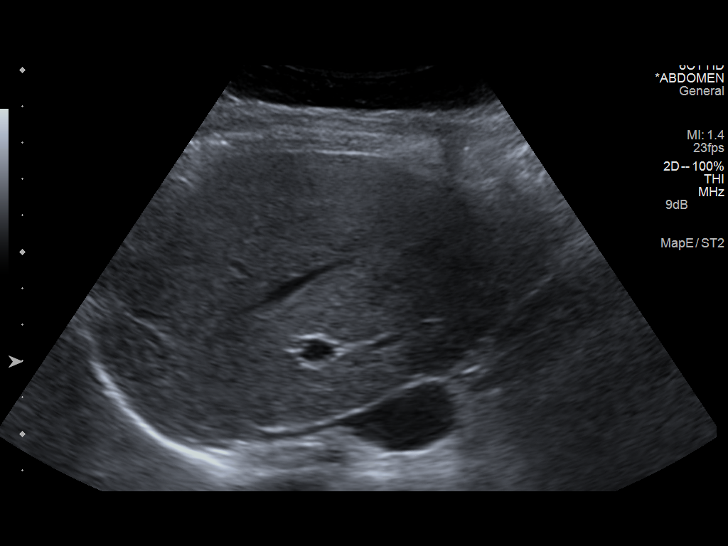

[14 of 25 positions shown; findings below may reference images not displayed]

FINDINGS: Gallbladder:

No gallstones or wall thickening visualized. No sonographic Murphy
sign noted.

Common bile duct:

Diameter: 3.9 mm.

Liver:

No focal lesion identified. Within normal limits in parenchymal
echogenicity.

IVC:

No abnormality visualized.

Pancreas:

Visualized portion unremarkable.

Spleen:

Size and appearance within normal limits.

Right Kidney:

Length: 10.6 cm.. Echogenicity within normal limits. No mass or
hydronephrosis visualized.

Left Kidney:

Length: 11.2 cm.. Echogenicity within normal limits. No mass or
hydronephrosis visualized.

Abdominal aorta:

No aneurysm visualized.

Other findings:

None.
IMPRESSION: No acute abnormality noted.

## 2014-12-29 IMAGING — DX DG HAND COMPLETE 3+V*R*
3 series · 3 of 3 positions shown · non-contrast
Comparison: None.

CLINICAL DATA: Status post fall, with injury to the right hand.
Initial encounter.

EXAM:
RIGHT HAND - COMPLETE 3+ VIEW

[hand pa]
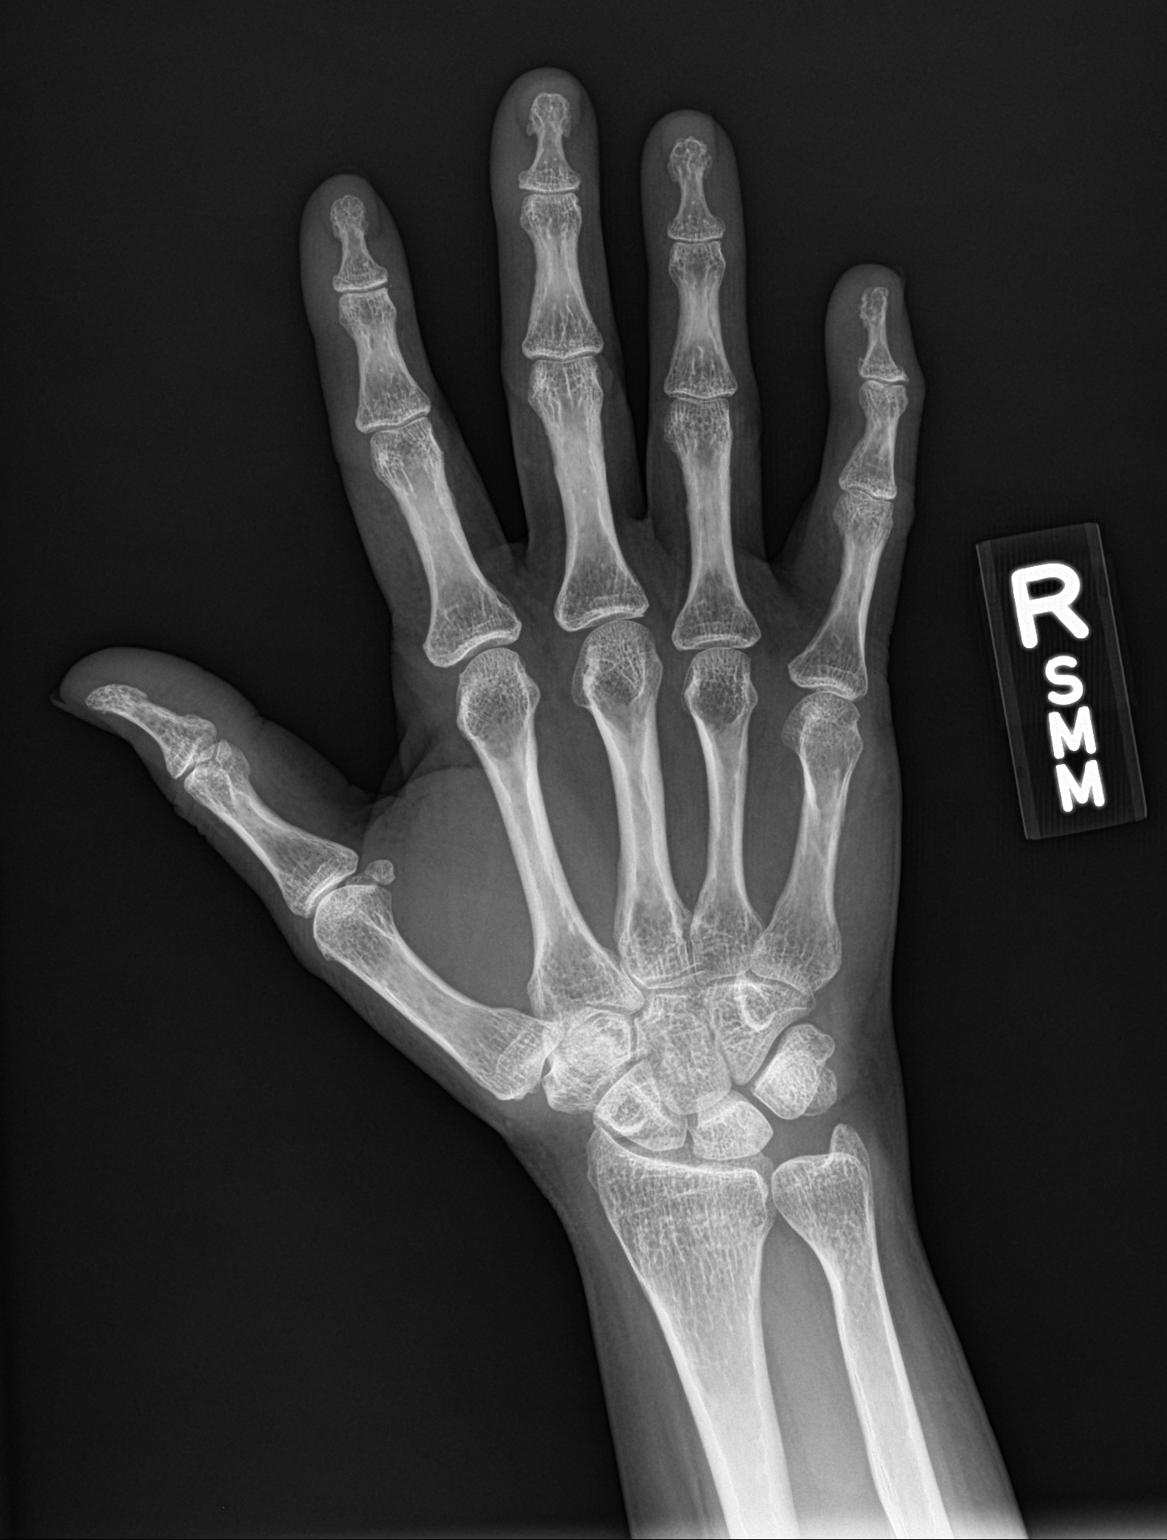

[hand obl]
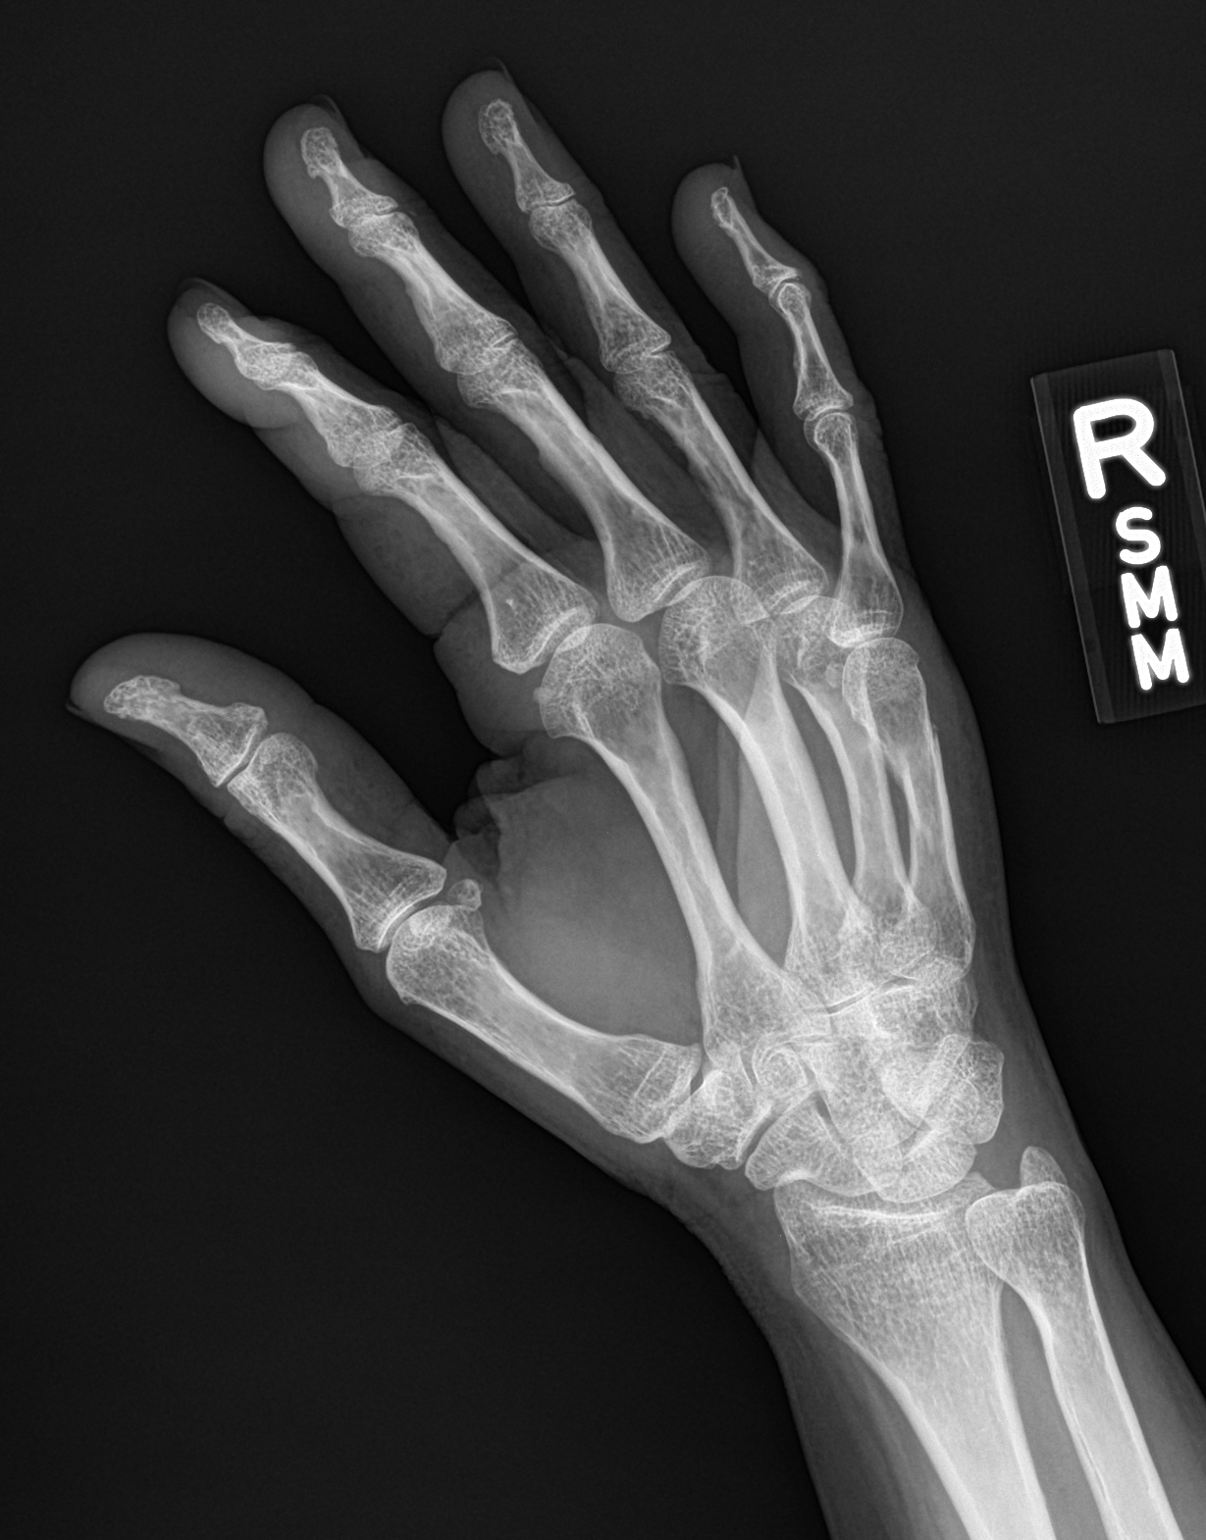

[hand lat]
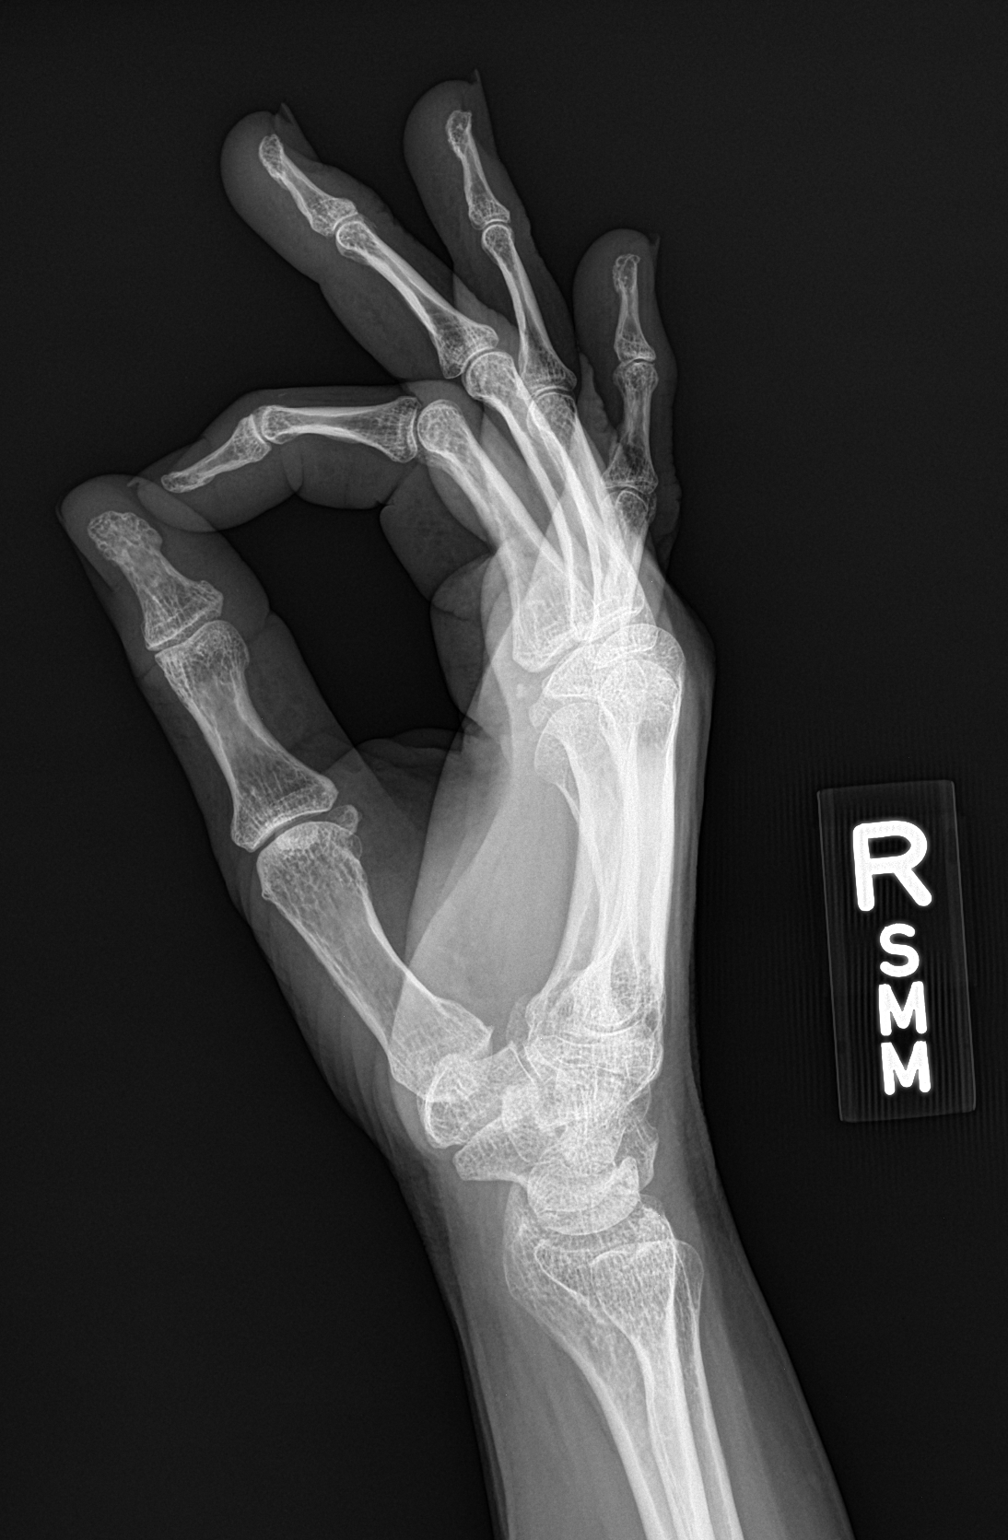

[3 of 3 positions shown; findings below may reference images not displayed]

FINDINGS: There appears to be a minimally displaced fracture through the
midshaft of the fifth metacarpal. Mild associated soft tissue
swelling is noted. The joint spaces are preserved. The carpal rows
are intact, and demonstrate normal alignment.
IMPRESSION: Minimally displaced fracture through the midshaft of the fifth
metacarpal.

## 2015-06-07 MED FILL — OMEPRAZOLE DR 40 MG CAPSULE: 40 | 30 days supply | Qty: 30 | Fill #3

## 2015-06-07 MED FILL — FLUoxetine HCL 20 MG CAPS: 20 | 30 days supply | Qty: 30 | Fill #3

## 2015-06-07 MED FILL — HYDROCHLOROTHIAZIDE 12.5 MG: 12.5 | 30 days supply | Qty: 30 | Fill #3

## 2015-06-07 MED FILL — ?ATORVASTATIN 20 MG TABLET: 20 | 30 days supply | Qty: 30 | Fill #1

## 2015-08-03 MED FILL — ?HYDROCHLOROTHIAZIDE 12.5MG: 12.5 | 30 days supply | Qty: 30 | Fill #0

## 2015-08-03 MED FILL — ?FLUOXETINE HCL 20MG TABLET: 20 | 30 days supply | Qty: 30 | Fill #0

## 2015-08-07 MED FILL — ?ATORVASTATIN 20 MG TABLET: 20 | 30 days supply | Qty: 30 | Fill #2

## 2015-09-14 MED FILL — ?FLUOXETINE HCL 20MG TABLET: 20 | 30 days supply | Qty: 30 | Fill #1

## 2015-09-14 MED FILL — ?ATORVASTATIN 20 MG TABLET: 20 | 30 days supply | Qty: 30 | Fill #3

## 2015-10-05 MED FILL — HYDROCHLOROTHIAZIDE 12.5 MG: 12.5 | 30 days supply | Qty: 30 | Fill #0

## 2015-10-05 MED FILL — ?ATORVASTATIN 20 MG TABLET: 20 | 30 days supply | Qty: 30 | Fill #0

## 2015-10-05 MED FILL — NAPROXEN 500 MG TABLET: 500 | 15 days supply | Qty: 30 | Fill #0

## 2015-10-05 MED FILL — FLUoxetine HCL 20 MG CAPS: 20 | 30 days supply | Qty: 30 | Fill #0

## 2015-11-14 MED FILL — HYDROCHLOROTHIAZIDE 12.5 MG: 12.5 | 30 days supply | Qty: 30 | Fill #1

## 2015-11-29 MED FILL — ?ATORVASTATIN 20 MG TABLET: 20 | 30 days supply | Qty: 30 | Fill #1

## 2015-11-29 MED FILL — FLUoxetine HCL 20 MG CAPS: 20 | 30 days supply | Qty: 30 | Fill #1

## 2015-11-29 MED FILL — NAPROXEN 500 MG TABLET: 500 | 15 days supply | Qty: 30 | Fill #1

## 2015-11-30 MED FILL — OMEPRAZOLE DR 40 MG CAPSULE: 40 | 30 days supply | Qty: 30 | Fill #0

## 2015-12-18 MED FILL — ?HYDROCHLOROTHIAZIDE 12.5MG: 12.5 | 30 days supply | Qty: 30 | Fill #2

## 2016-01-08 MED FILL — ?HYDROCHLOROTHIAZIDE 12.5MG: 12.5 | 30 days supply | Qty: 30 | Fill #3

## 2016-01-08 MED FILL — ?ATORVASTATIN 20 MG TABLET: 20 | 30 days supply | Qty: 30 | Fill #2

## 2016-01-08 MED FILL — FLUoxetine HCL 20 MG CAPS: 20 | 30 days supply | Qty: 30 | Fill #2

## 2016-02-22 MED FILL — HYDROCHLOROTHIAZIDE 12.5 MG: 12.5 | 30 days supply | Qty: 30 | Fill #4

## 2016-02-22 MED FILL — ATORVASTATIN 20 MG TABLET: 20 | 30 days supply | Qty: 30 | Fill #3

## 2016-02-22 MED FILL — FLUoxetine HCL 20 MG CAPS: 20 | 30 days supply | Qty: 30 | Fill #3

## 2016-04-04 MED FILL — FLUoxetine HCL 20 MG CAPS: 20 | 30 days supply | Qty: 30 | Fill #4

## 2016-04-04 MED FILL — HYDROCHLOROTHIAZIDE 12.5 MG: 12.5 | 30 days supply | Qty: 30 | Fill #5

## 2016-04-04 MED FILL — ATORVASTATIN 20 MG TABLET: 20 | 30 days supply | Qty: 30 | Fill #4

## 2016-05-14 MED FILL — ?HYDROCHLOROTHIAZIDE 12.5MG: 12.5 | 30 days supply | Qty: 30 | Fill #6

## 2016-05-14 MED FILL — FLUoxetine HCL 20 MG CAPS: 20 | 30 days supply | Qty: 30 | Fill #5

## 2016-05-14 MED FILL — ?ATORVASTATIN 20 MG TABLET: 20 | 30 days supply | Qty: 30 | Fill #5

## 2016-05-14 MED FILL — NAPROXEN 500 MG TABLET: 500 | 15 days supply | Qty: 30 | Fill #2

## 2016-06-24 MED FILL — ATORVASTATIN 20 MG TABLET: 20 | 30 days supply | Qty: 30 | Fill #0

## 2016-06-24 MED FILL — ?HYDROCHLOROTHIAZIDE 12.5MG: 12.5 | 30 days supply | Qty: 30 | Fill #7

## 2016-06-24 MED FILL — FLUoxetine HCL 20 MG CAPS: 20 | 30 days supply | Qty: 30 | Fill #0

## 2016-07-17 MED FILL — OMEPRAZOLE DR 40 MG CAPSULE: 40 | 30 days supply | Qty: 30 | Fill #1

## 2016-07-17 MED FILL — FLUoxetine HCL 20 MG CAPS: 20 | 30 days supply | Qty: 30 | Fill #0

## 2016-07-17 MED FILL — ATORVASTATIN 20 MG TABLET: 20 | 30 days supply | Qty: 30 | Fill #0

## 2016-07-17 MED FILL — NAPROXEN 500 MG TABLET: 500 | 15 days supply | Qty: 30 | Fill #3

## 2016-07-17 MED FILL — ?HYDROCHLOROTHIAZIDE 12.5MG: 12.5 | 30 days supply | Qty: 30 | Fill #8

## 2016-08-27 MED FILL — NAPROXEN 500 MG TABLET: 500 | 15 days supply | Qty: 30 | Fill #4

## 2016-08-27 MED FILL — OMEPRAZOLE DR 40 MG CAPSULE: 40 | 30 days supply | Qty: 30 | Fill #2

## 2016-08-27 MED FILL — FLUoxetine HCL 20 MG CAPS: 20 | 30 days supply | Qty: 30 | Fill #0

## 2016-08-27 MED FILL — ATORVASTATIN 20 MG TABLET: 20 | 30 days supply | Qty: 30 | Fill #0

## 2016-08-27 MED FILL — ?HYDROCHLOROTHIAZIDE 12.5MG: 12.5 | 30 days supply | Qty: 30 | Fill #9

## 2016-09-26 MED FILL — ?HYDROCHLOROTHIAZIDE 12.5MG: 12.5 | 30 days supply | Qty: 30 | Fill #10

## 2016-09-26 MED FILL — NAPROXEN 500 MG TABLET: 500 | 15 days supply | Qty: 30 | Fill #5

## 2016-09-27 MED FILL — FLUoxetine HCL 20 MG CAPS: 20 | 30 days supply | Qty: 30 | Fill #0

## 2016-09-27 MED FILL — OMEPRAZOLE DR 40 MG CAPSULE: 40 | 30 days supply | Qty: 30 | Fill #0

## 2016-09-27 MED FILL — ATORVASTATIN 20 MG TABLET: 20 | 30 days supply | Qty: 30 | Fill #0

## 2016-11-20 MED FILL — ATORVASTATIN 20 MG TABLET: 20 | 30 days supply | Qty: 30 | Fill #0

## 2016-11-20 MED FILL — OMEPRAZOLE DR 40 MG CAPSULE: 40 | 30 days supply | Qty: 30 | Fill #0

## 2016-11-20 MED FILL — ?HYDROCHLOROTHIAZIDE 12.5MG: 12.5 | 30 days supply | Qty: 30 | Fill #0

## 2016-11-20 MED FILL — FLUoxetine HCL 20 MG CAPS: 20 | 30 days supply | Qty: 30 | Fill #0

## 2016-12-05 MED FILL — VIT D2 1.25 MG (50,000 UNIT: 1.25 MG | 30 days supply | Qty: 4 | Fill #0

## 2016-12-20 MED FILL — ?ATORVASTATIN 20 MG TABLET: 20 | 30 days supply | Qty: 30 | Fill #0

## 2016-12-20 MED FILL — ?HYDROCHLOROTHIAZIDE 12.5MG: 12.5 | 30 days supply | Qty: 30 | Fill #0

## 2016-12-24 MED FILL — FLUoxetine HCL 20 MG CAPS: 20 | 30 days supply | Qty: 30 | Fill #0

## 2016-12-25 MED FILL — NAPROXEN 500 MG TABLET: 500 | 15 days supply | Qty: 30 | Fill #0

## 2016-12-30 MED FILL — VIT D2 1.25 MG (50,000 UNIT: 1.25 MG | 30 days supply | Qty: 4 | Fill #1

## 2017-01-20 MED FILL — HYDROCHLOROTHIAZIDE 12.5 MG: 12.5 | 30 days supply | Qty: 30 | Fill #1

## 2017-01-20 MED FILL — ?ATORVASTATIN 20 MG TABLET: 20 | 30 days supply | Qty: 30 | Fill #1

## 2017-01-20 MED FILL — FLUoxetine HCL 20 MG CAPS: 20 | 30 days supply | Qty: 30 | Fill #1

## 2017-02-04 IMAGING — US ULTRASOUND LEFT BREAST LIMITED
1 series · 2 of 2 positions shown · non-contrast
Comparison: Previous exam(s).

CLINICAL DATA: The patient presents with focal pain in the left
breast.

EXAM:
2D DIGITAL DIAGNOSTIC BILATERAL MAMMOGRAM WITH CAD AND ADJUNCT TOMO
ULTRASOUND BILATERAL BREAST

[Series 1: ultrasound left breast limited · 0.06mm/px · 2 of 2 slices shown]
[im 1/2]
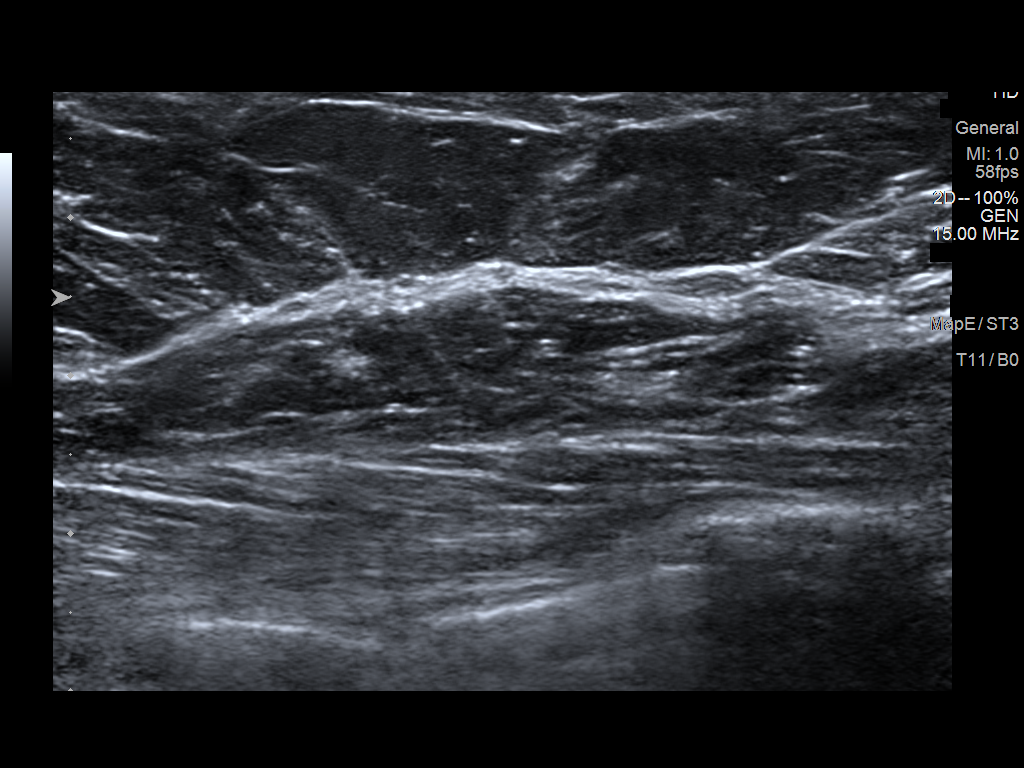
[im 2/2]
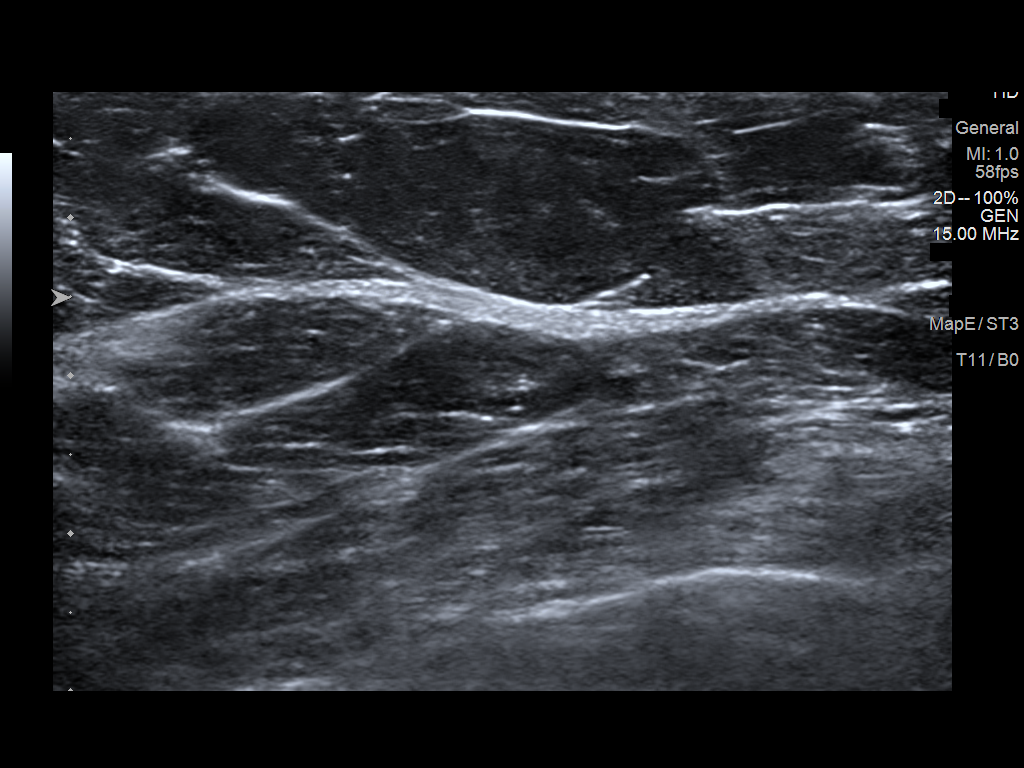

[2 of 2 positions shown; findings below may reference images not displayed]

ACR Breast Density Category b: There are scattered areas of
fibroglandular density.
FINDINGS: There is an oval low-density asymmetry in the slightly lateral right
breast. This is likely located near or just below the level of the
nipple based on 3D imaging. There is an asymmetry in the slightly
superior left breast on the MLO view only which is circumscribed and
low-density as well. No other suspicious findings

Mammographic images were processed with CAD.

On physical exam, no suspicious lumps are identified.

Targeted ultrasound is performed, showing no abnormalities in the
region of the patient's focal pain. No other abnormalities
identified sonographically in the left breast. No correlate for the
focal asymmetry seen on the left MLO view.

There is a hypoechoic mass in the right breast at 9 o'clock, 1 cm
from the nipple measuring 6 x 2 x 5 mm. This may represent a
complicated cyst or small fibroadenoma.
IMPRESSION: There is a probably benign mass in the right breast at 9 o'clock and
a probably benign asymmetry in the left breast just above the level
of the nipple on the MLO view only with no sonographic correlate. No
other suspicious findings.

RECOMMENDATION:
Recommend six-month follow-up bilateral mammography to ensure
stability of the probably benign masses. Treatment of the patient's
focal pain should be based on clinical and physical exam given lack
of imaging findings.

I have discussed the findings and recommendations with the patient.
Results were also provided in writing at the conclusion of the
visit. If applicable, a reminder letter will be sent to the patient
regarding the next appointment.

BI-RADS CATEGORY  3: Probably benign.

## 2017-02-04 IMAGING — MG 2D DIGITAL DIAGNOSTIC BILATERAL MAMMOGRAM WITH CAD AND ADJUNCT T
8 of 15 series · 8 of 35 positions shown · non-contrast
Comparison: Previous exam(s).

CLINICAL DATA: The patient presents with focal pain in the left
breast.

EXAM:
2D DIGITAL DIAGNOSTIC BILATERAL MAMMOGRAM WITH CAD AND ADJUNCT TOMO
ULTRASOUND BILATERAL BREAST

[L CC (1 of 2)]
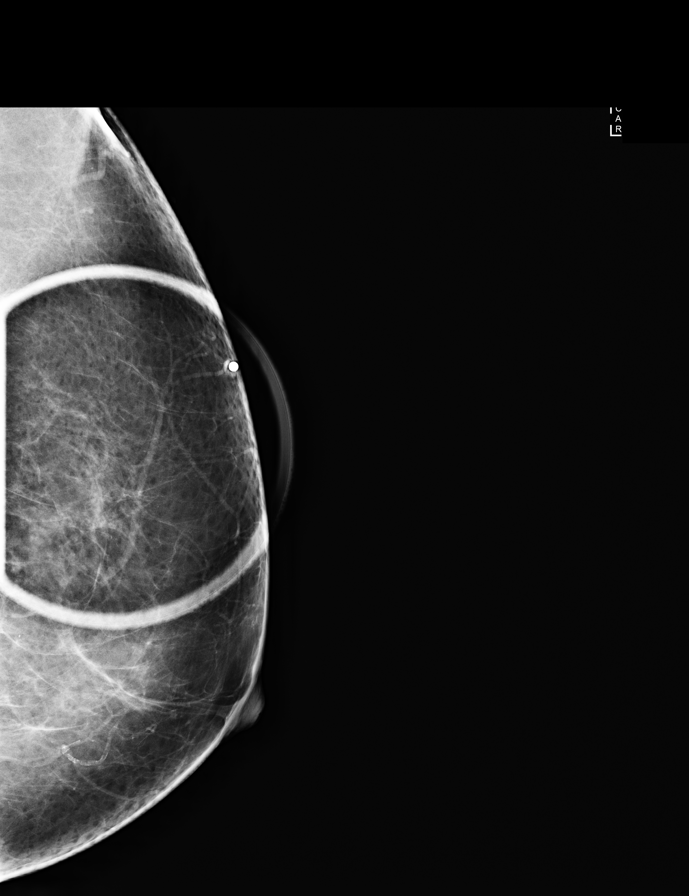

[R MLO]
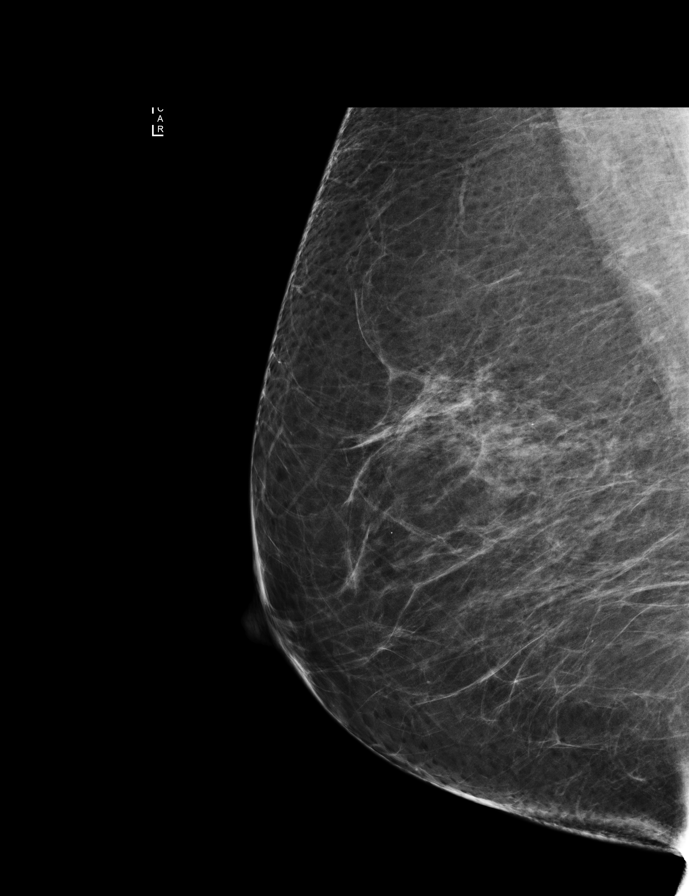

[R CC]
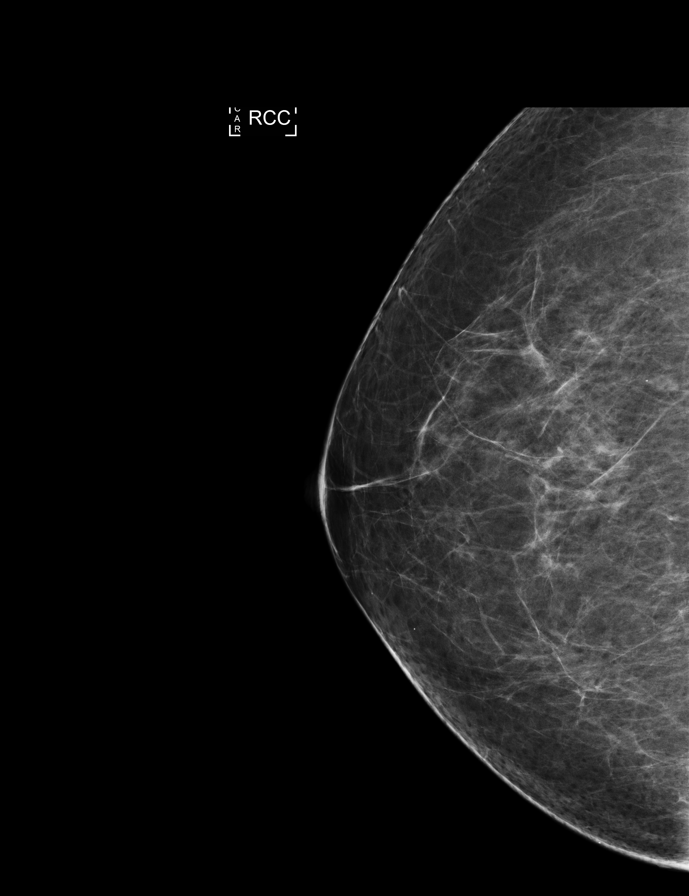

[R MLO synth-2D]
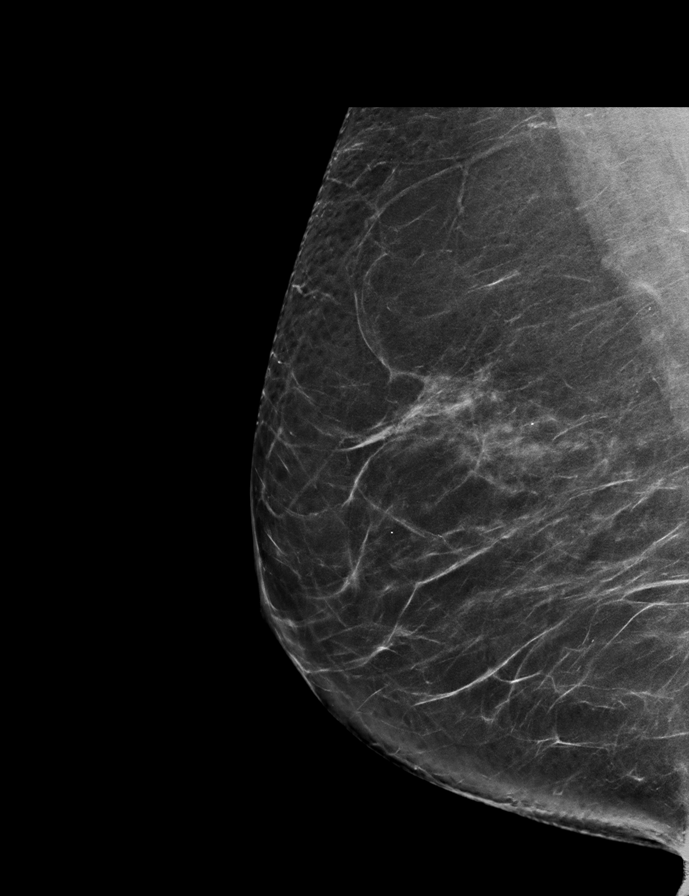

[L CC synth-2D (1 of 2)]
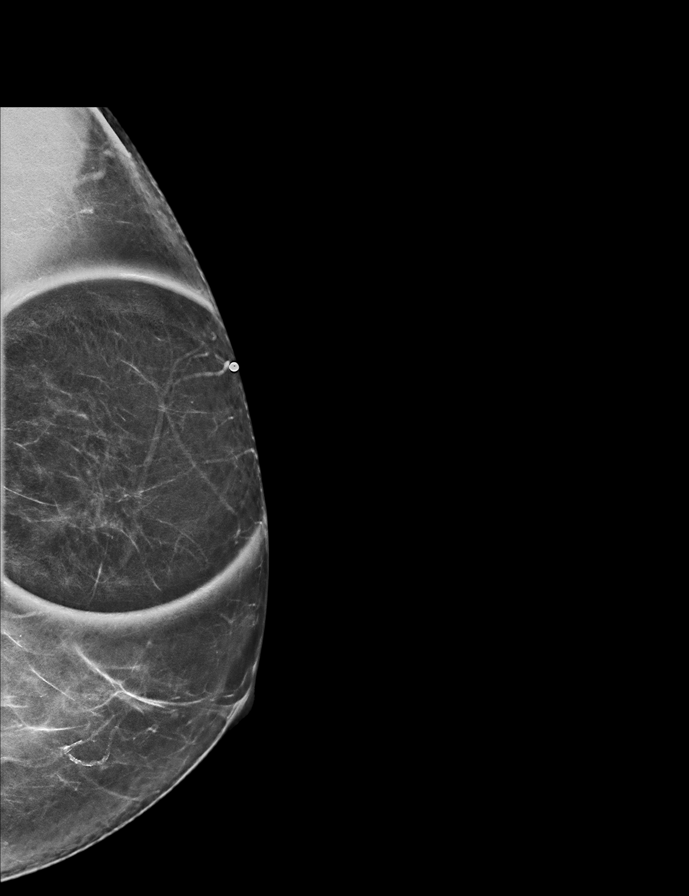

[L CC (2 of 2)]
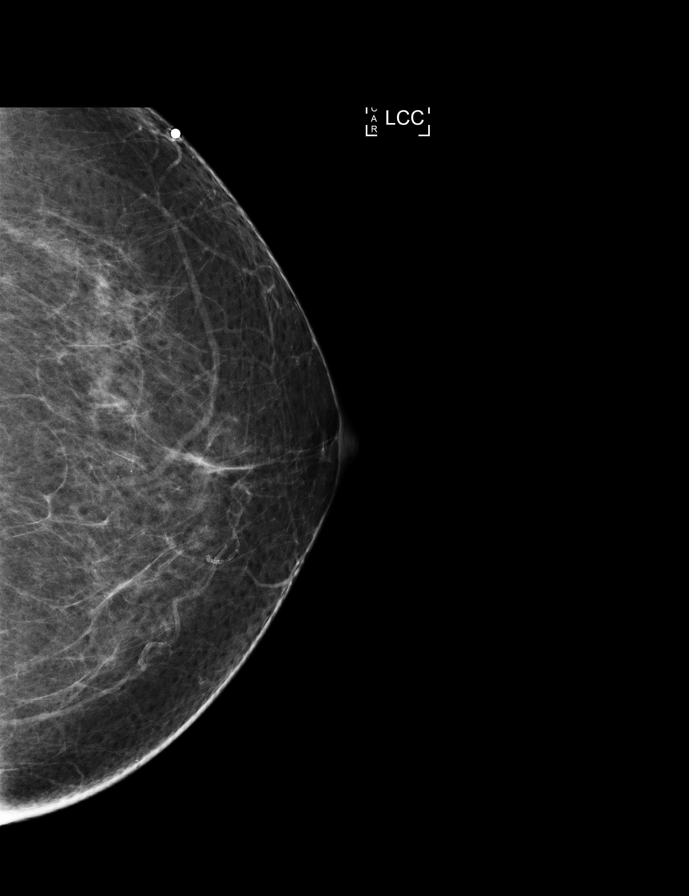

[L MLO synth-2D]
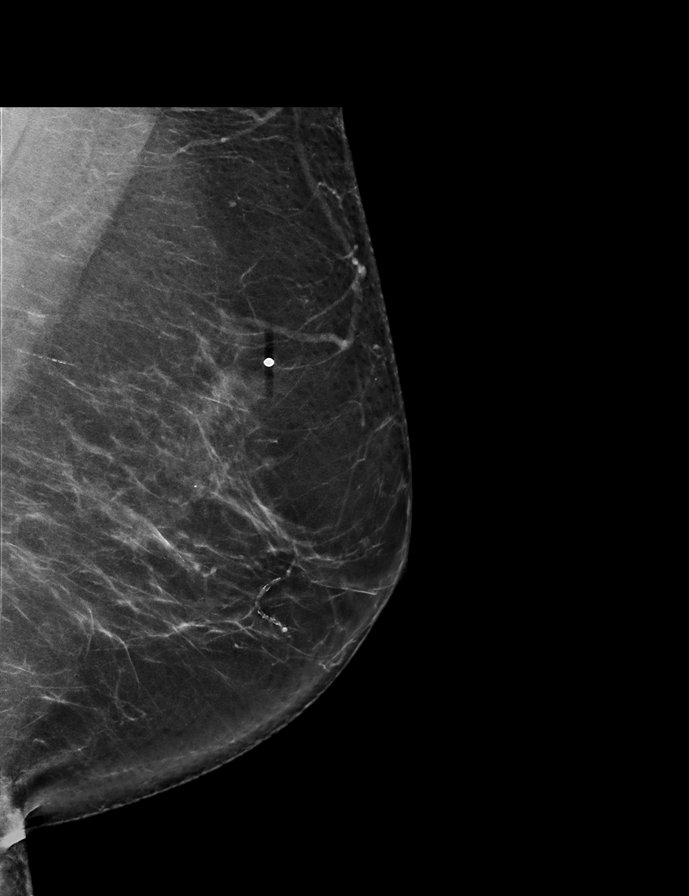

[L CC synth-2D (2 of 2)]
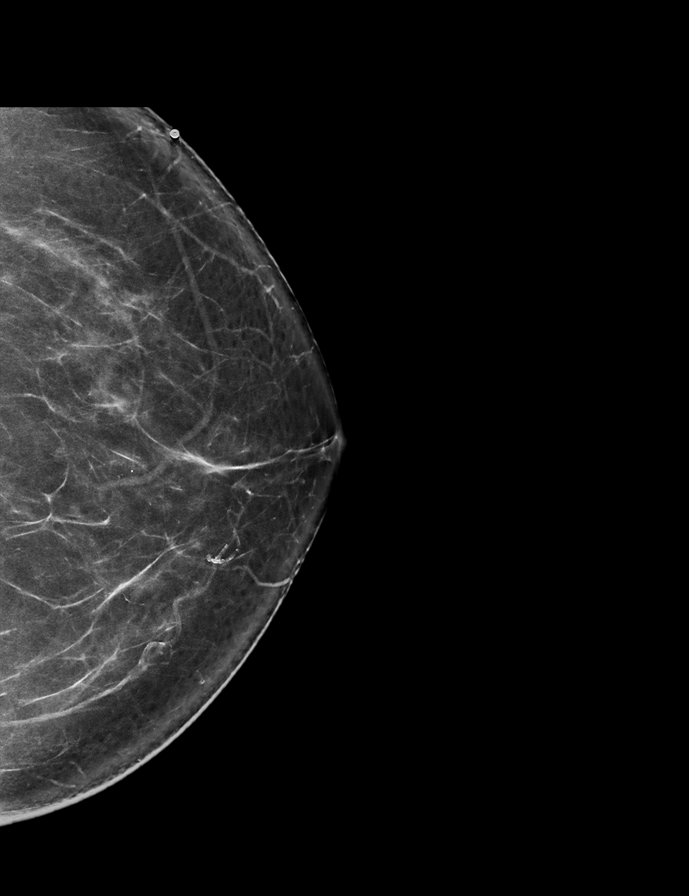

[8 of 35 positions shown; findings below may reference images not displayed]

ACR Breast Density Category b: There are scattered areas of
fibroglandular density.
FINDINGS: There is an oval low-density asymmetry in the slightly lateral right
breast. This is likely located near or just below the level of the
nipple based on 3D imaging. There is an asymmetry in the slightly
superior left breast on the MLO view only which is circumscribed and
low-density as well. No other suspicious findings

Mammographic images were processed with CAD.

On physical exam, no suspicious lumps are identified.

Targeted ultrasound is performed, showing no abnormalities in the
region of the patient's focal pain. No other abnormalities
identified sonographically in the left breast. No correlate for the
focal asymmetry seen on the left MLO view.

There is a hypoechoic mass in the right breast at 9 o'clock, 1 cm
from the nipple measuring 6 x 2 x 5 mm. This may represent a
complicated cyst or small fibroadenoma.
IMPRESSION: There is a probably benign mass in the right breast at 9 o'clock and
a probably benign asymmetry in the left breast just above the level
of the nipple on the MLO view only with no sonographic correlate. No
other suspicious findings.

RECOMMENDATION:
Recommend six-month follow-up bilateral mammography to ensure
stability of the probably benign masses. Treatment of the patient's
focal pain should be based on clinical and physical exam given lack
of imaging findings.

I have discussed the findings and recommendations with the patient.
Results were also provided in writing at the conclusion of the
visit. If applicable, a reminder letter will be sent to the patient
regarding the next appointment.

BI-RADS CATEGORY  3: Probably benign.

## 2017-02-18 MED FILL — HYDROCHLOROTHIAZIDE 12.5 MG: 12.5 | 30 days supply | Qty: 30 | Fill #2

## 2017-02-18 MED FILL — NAPROXEN 500 MG TABLET: 500 | 15 days supply | Qty: 30 | Fill #1

## 2017-02-18 MED FILL — ?ATORVASTATIN 20 MG TABLET: 20 | 30 days supply | Qty: 30 | Fill #2

## 2017-02-18 MED FILL — FLUoxetine HCL 20 MG CAPS: 20 | 30 days supply | Qty: 30 | Fill #2

## 2017-03-18 MED FILL — ?HYDROCHLOROTHIAZIDE 12.5MG: 12.5 | 30 days supply | Qty: 30 | Fill #3

## 2017-03-18 MED FILL — NAPROXEN 500 MG TABLET: 500 | 15 days supply | Qty: 30 | Fill #2

## 2017-03-18 MED FILL — ?FLUOXETINE HCL 20 MG CAP: 20 | 30 days supply | Qty: 30 | Fill #3

## 2017-03-18 MED FILL — ?ATORVASTATIN 20 MG TABLET: 20 | 30 days supply | Qty: 30 | Fill #3

## 2017-04-07 IMAGING — DX DG HIP (WITH OR WITHOUT PELVIS) 2-3V*R*
3 series · 3 of 3 positions shown · non-contrast
Comparison: Bilateral hip series of [DATE]

CLINICAL DATA: Chronic right hip pain for the past 9 years.
Previously diagnosed with osteoarthritis.

EXAM:
DG HIP (WITH OR WITHOUT PELVIS) 2-3V RIGHT

[pelvis ap]
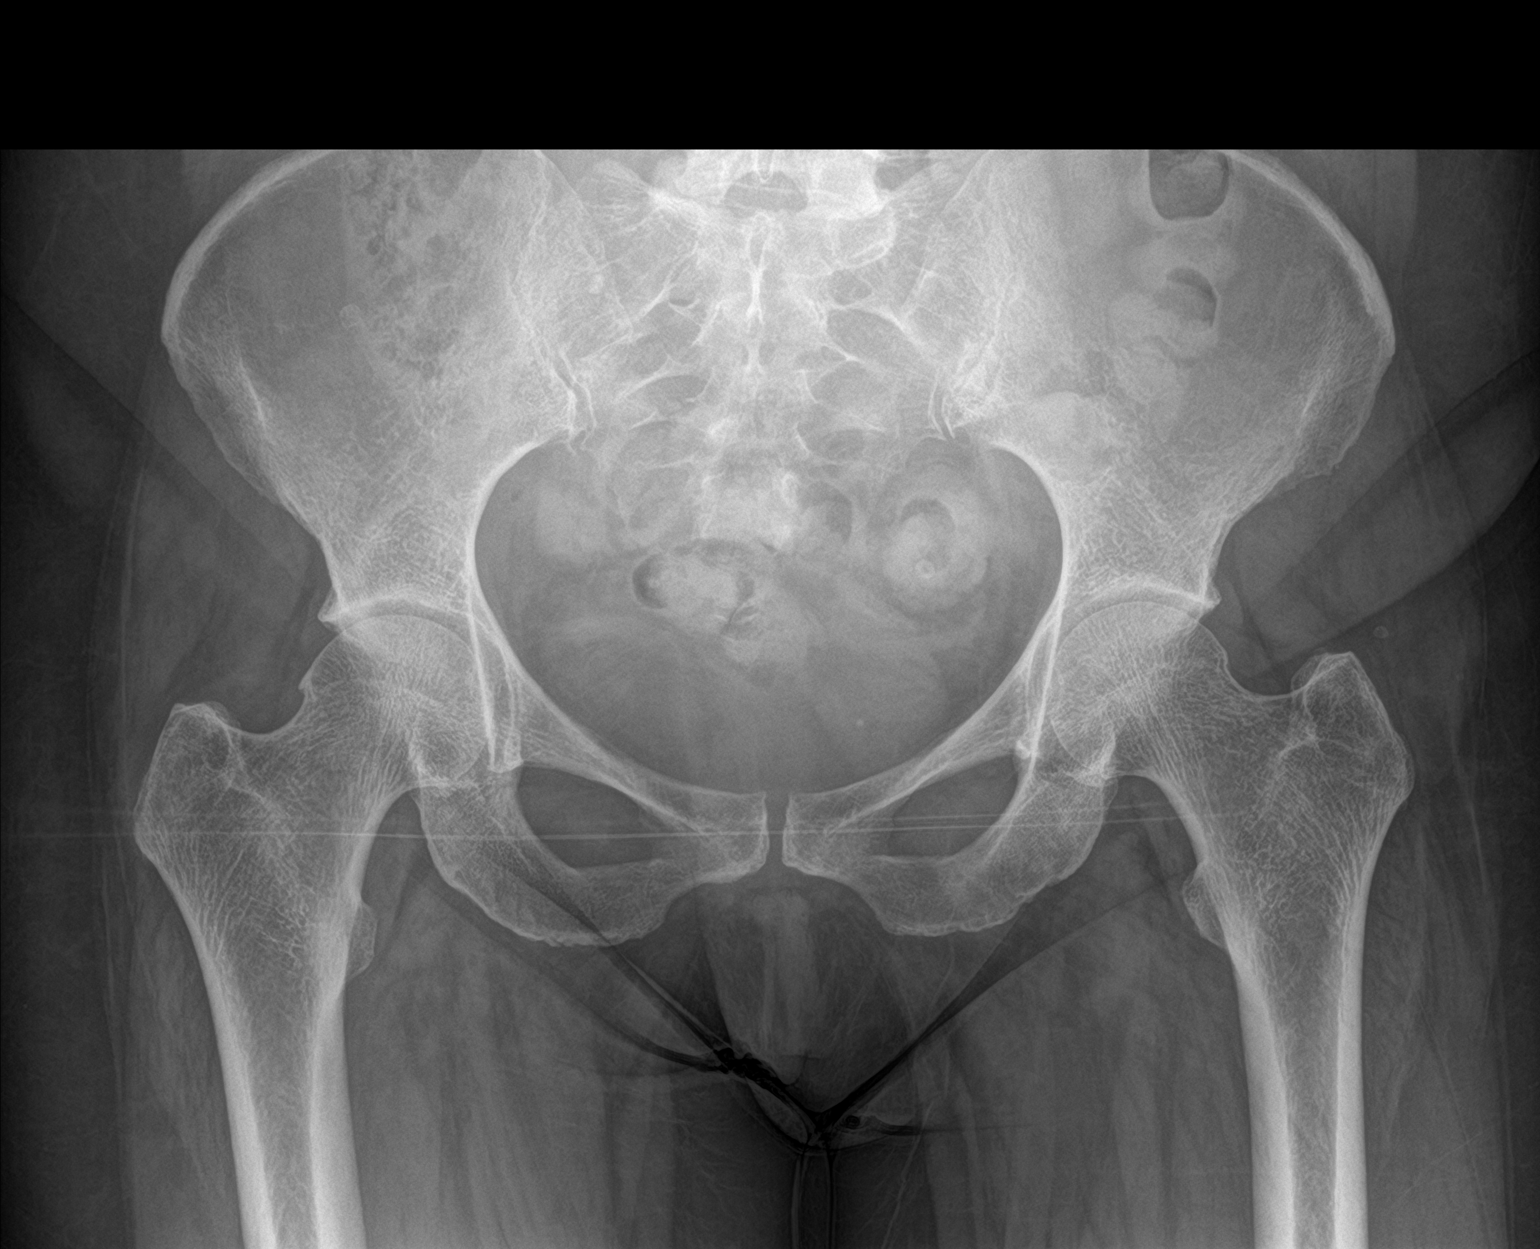

[hip ap]
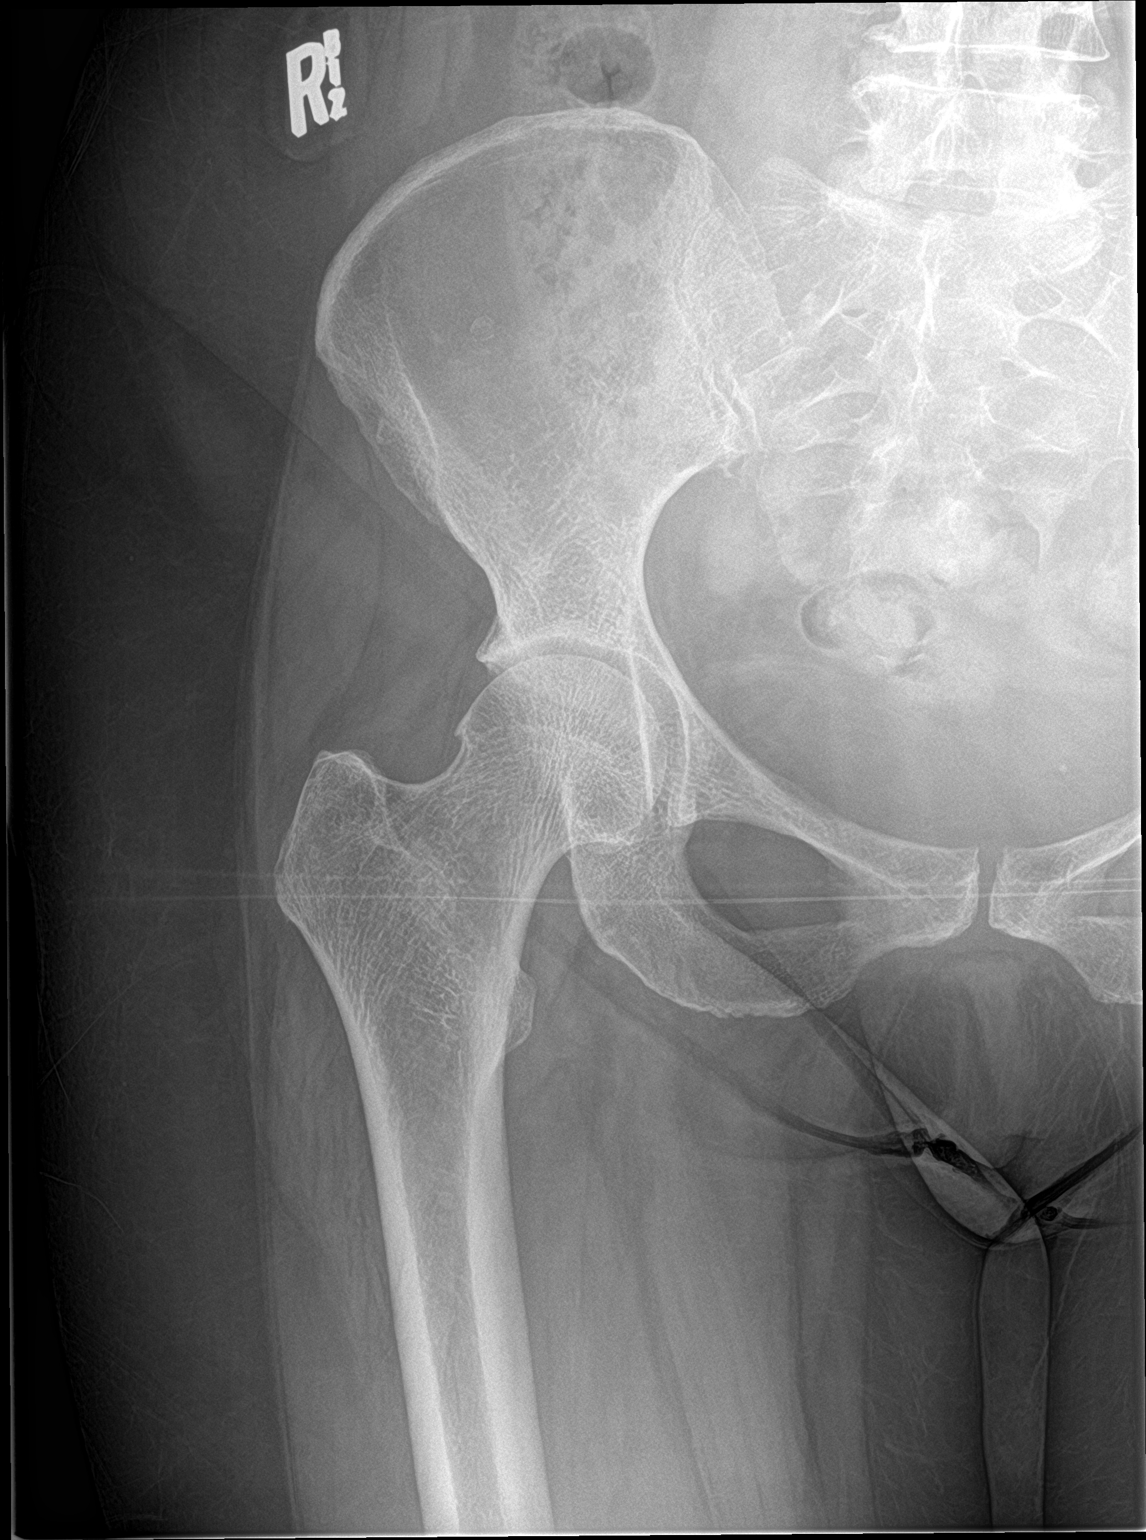

[hip lat]
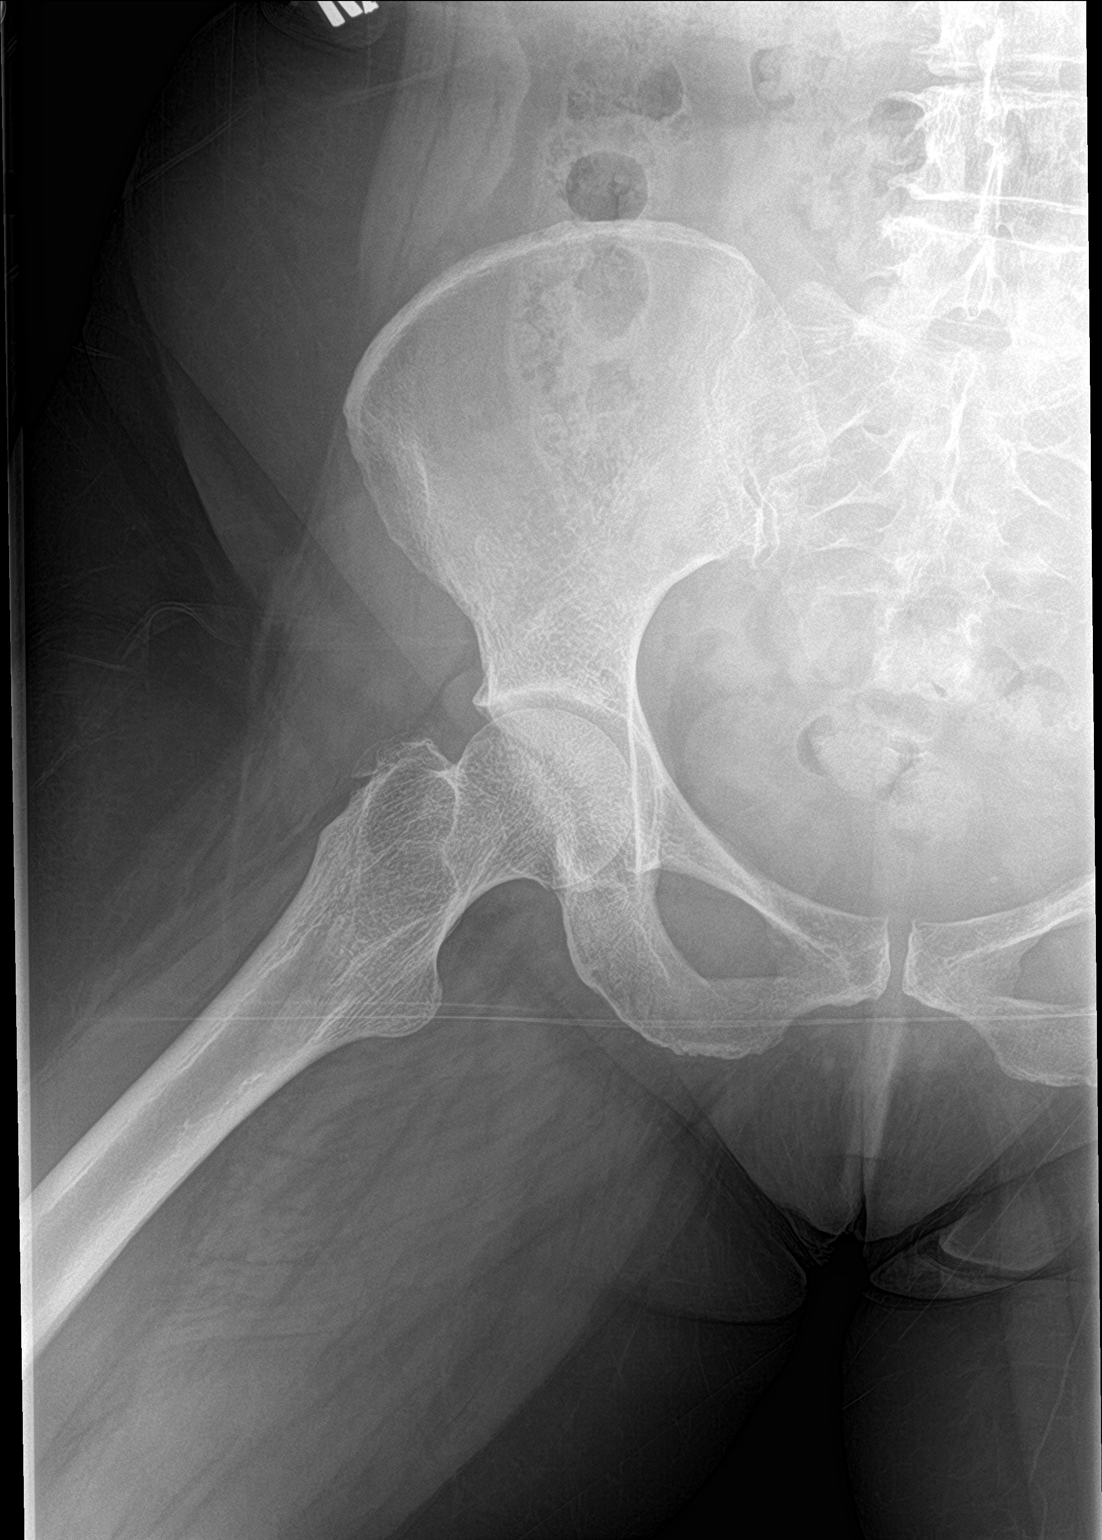

[3 of 3 positions shown; findings below may reference images not displayed]

FINDINGS: The bones are subjectively adequately mineralized. The right hip
joint space is reasonably well-maintained. A tiny marginal spur
arises from the superior lateral aspect of the femoral head. The
femoral neck, intertrochanteric, and subtrochanteric regions are
normal. The observed portions of the bony pelvis exhibit no acute
abnormalities.
IMPRESSION: There is very mild osteoarthritic change of the right hip manifested
by spurring of the superolateral os articular margin of the femoral
head. The joint space is reasonably well-maintained.

## 2017-04-07 MED FILL — MELOXICAM 7.5 MG TABLET: 7.5 | 30 days supply | Qty: 30 | Fill #0

## 2017-04-07 MED FILL — ?OMEPRAZOLE DR 20 MG CAPSUL: 20 | 30 days supply | Qty: 30 | Fill #0

## 2017-04-29 MED FILL — HYDROCHLOROTHIAZIDE 12.5 MG: 12.5 | 30 days supply | Qty: 30 | Fill #4

## 2017-04-29 MED FILL — ATORVASTATIN 20 MG TABLET: 20 | 30 days supply | Qty: 30 | Fill #4

## 2017-04-29 MED FILL — FLUoxetine HCL 20 MG CAPS: 20 | 30 days supply | Qty: 30 | Fill #4

## 2017-06-04 IMAGING — DX DG WRIST COMPLETE 3+V*R*
4 series · 4 of 4 positions shown · non-contrast
Comparison: Right hand [DATE]

CLINICAL DATA: Pain following fall

EXAM:
RIGHT WRIST - COMPLETE 3+ VIEW

[wrist pa]
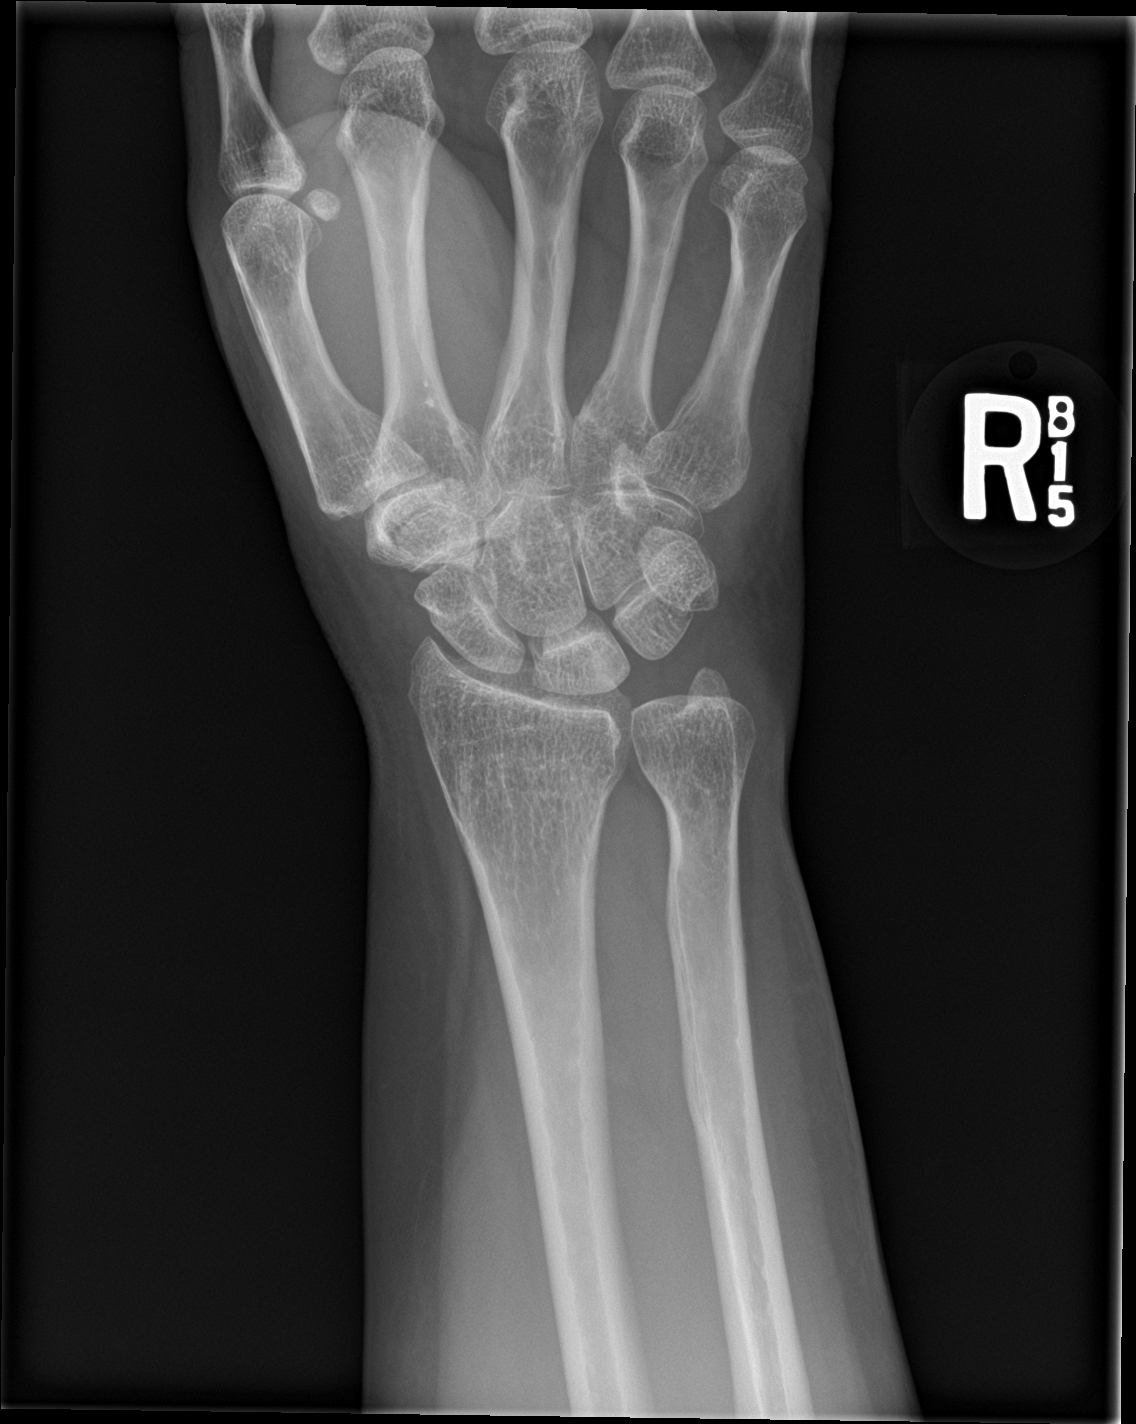

[wrist navicular]
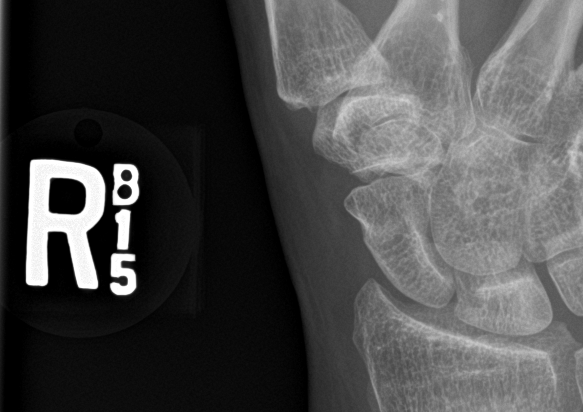

[wrist obl]
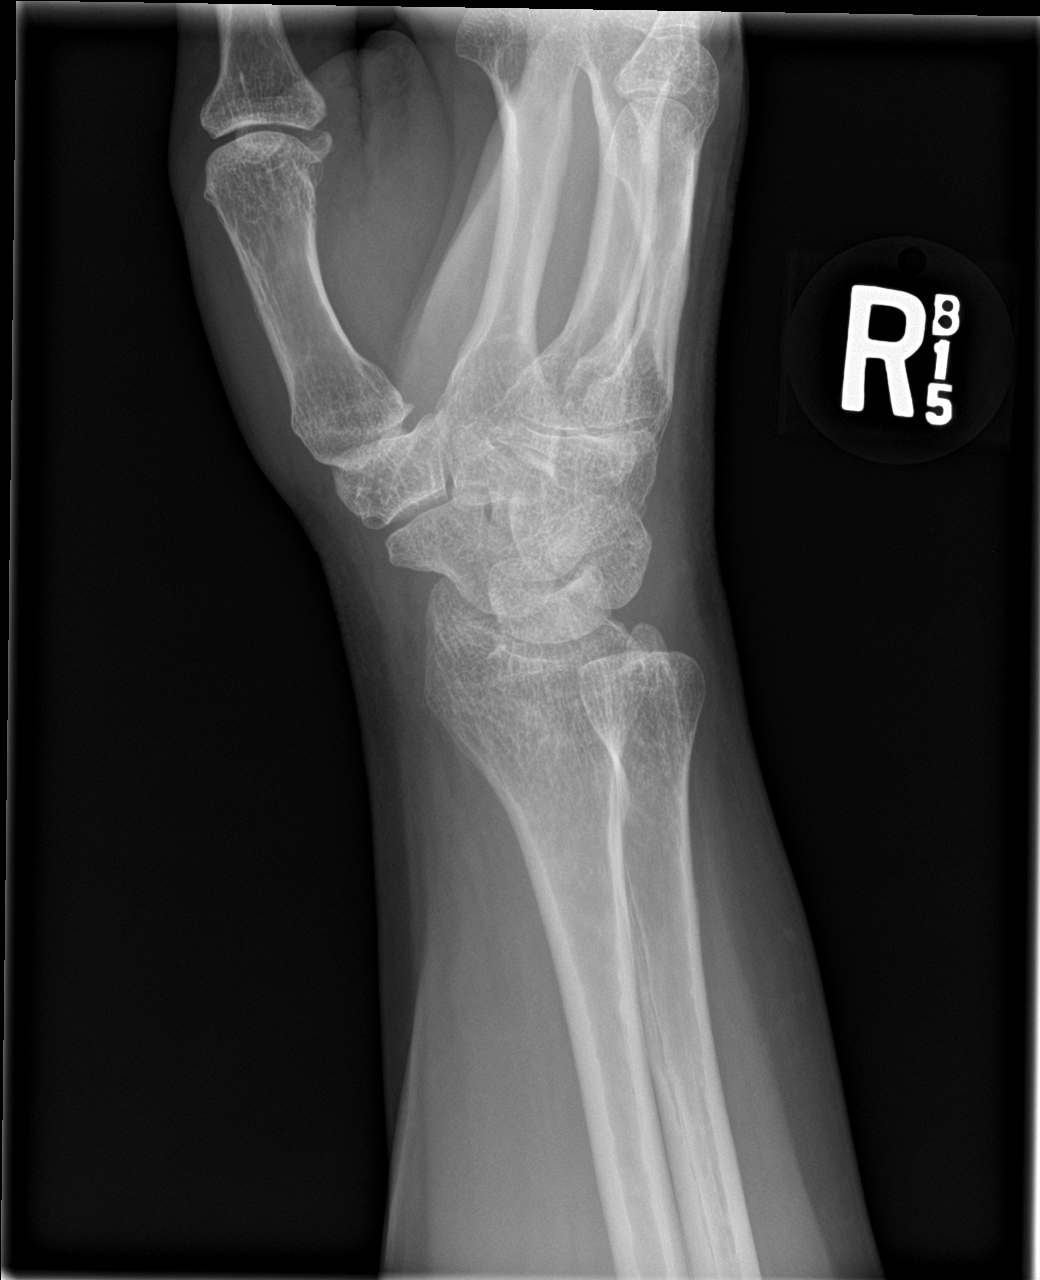

[wrist lat]
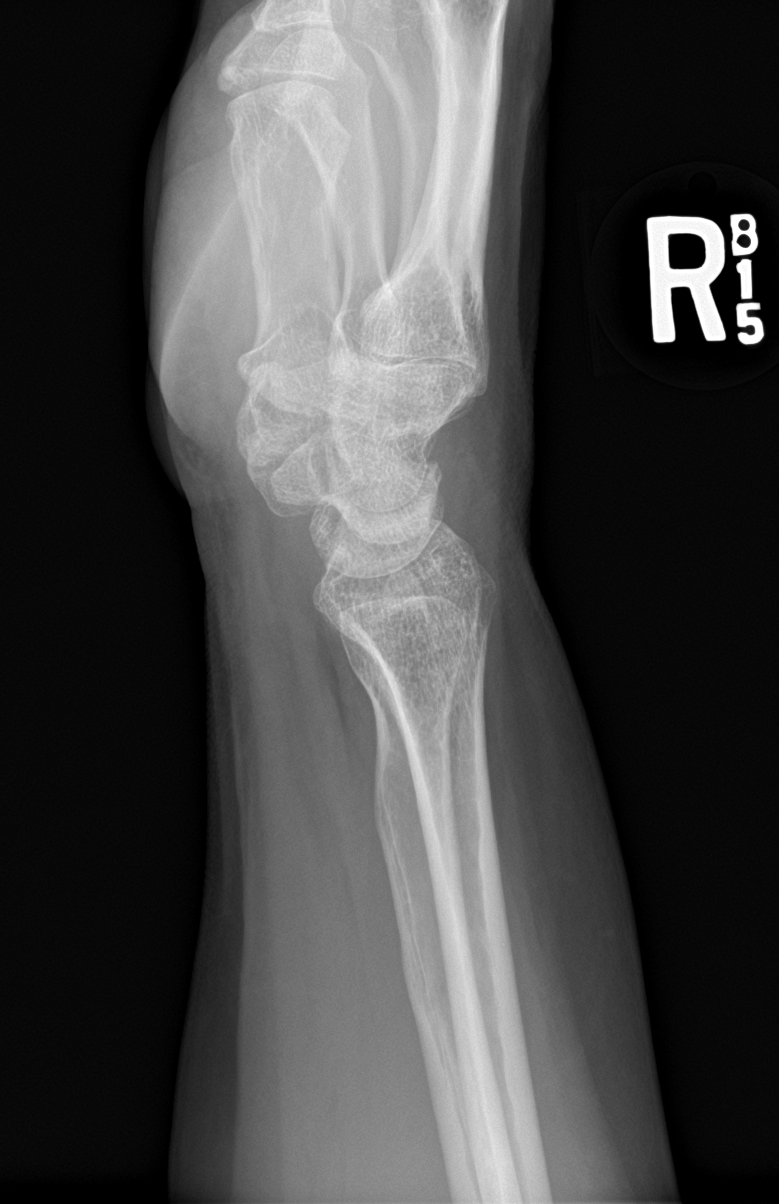

[4 of 4 positions shown; findings below may reference images not displayed]

FINDINGS: Frontal, oblique, lateral, and ulnar deviation scaphoid images were
obtained. No fracture or dislocation. Joint spaces appear normal. No
erosive change or intra-articular calcification.
IMPRESSION: No fracture or dislocation.  No appreciable arthropathy.

## 2017-06-06 MED FILL — ?ATORVASTATIN 20MG TABLET: 20 | 30 days supply | Qty: 30 | Fill #5

## 2017-06-06 MED FILL — FLUoxetine HCL 20 MG CAPS: 20 | 30 days supply | Qty: 30 | Fill #5

## 2017-06-06 MED FILL — HYDROCHLOROTHIAZIDE 12.5 MG: 12.5 | 30 days supply | Qty: 30 | Fill #5

## 2017-06-06 MED FILL — NAPROXEN 500 MG TABLET: 500 | 15 days supply | Qty: 30 | Fill #3

## 2017-07-08 MED FILL — ?HYDROCHLOROTHIAZIDE 12.5MG: 12.5 | 30 days supply | Qty: 30 | Fill #6

## 2017-07-08 MED FILL — ?ATORVASTATIN 20MG TABL: 20 | 30 days supply | Qty: 30 | Fill #6

## 2017-07-08 MED FILL — FLUoxetine HCL 20 MG CAPS: 20 | 30 days supply | Qty: 30 | Fill #6

## 2017-08-05 IMAGING — US ULTRASOUND RIGHT BREAST LIMITED
1 series · 7 of 7 positions shown · non-contrast
Comparison: Previous exam(s).

CLINICAL DATA: 61-year-old patient presents for six-month follow-up
of probably benign nodule in the right breast and probably benign
asymmetry in the left breast.

EXAM:
DIGITAL DIAGNOSTIC BILATERAL MAMMOGRAM WITH CAD AND TOMO
ULTRASOUND RIGHT BREAST

[Series 1: ultrasound right breast limited · 0.07mm/px · 7 of 7 slices shown]
[im 1/7]
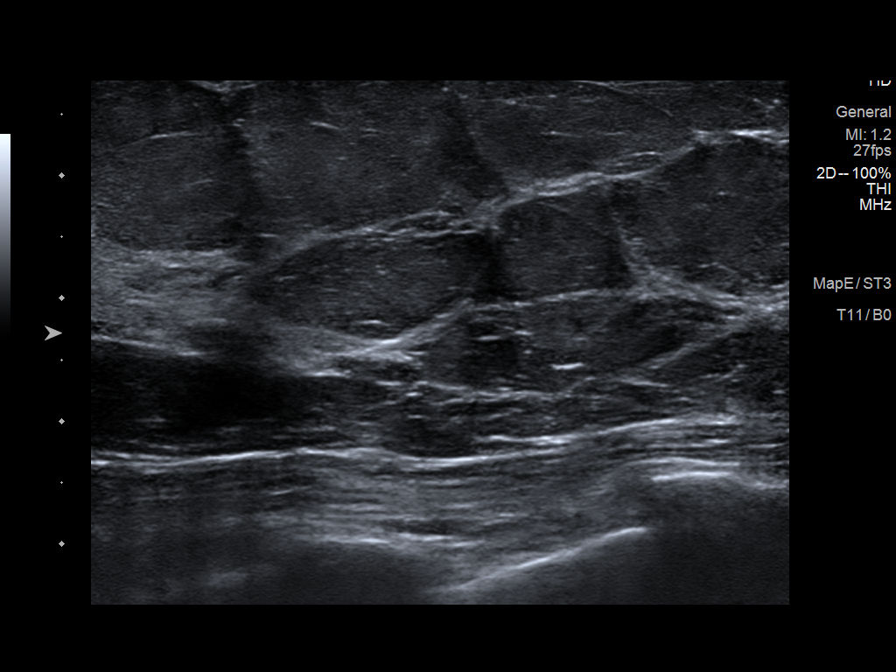
[im 2/7]
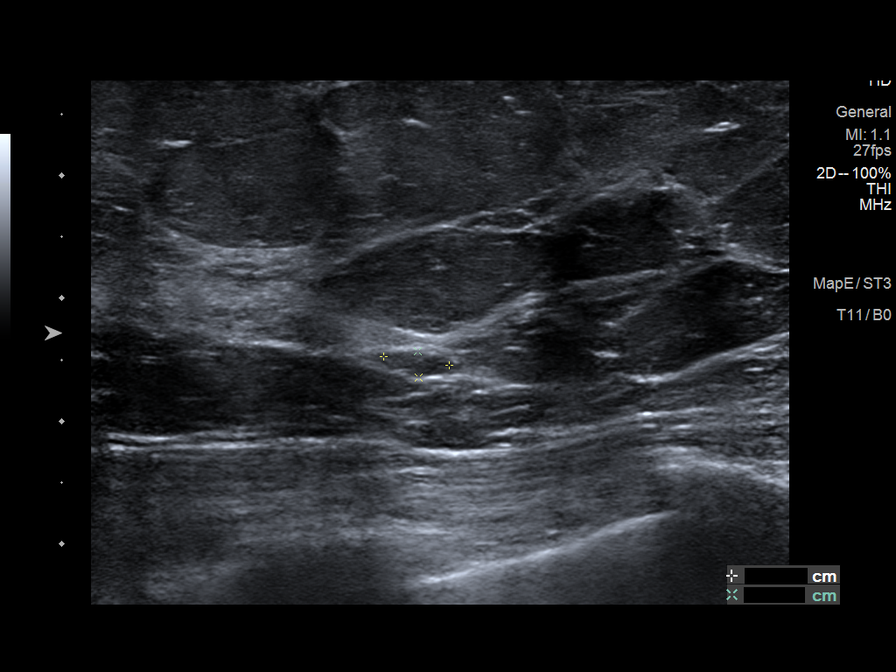
[im 3/7]
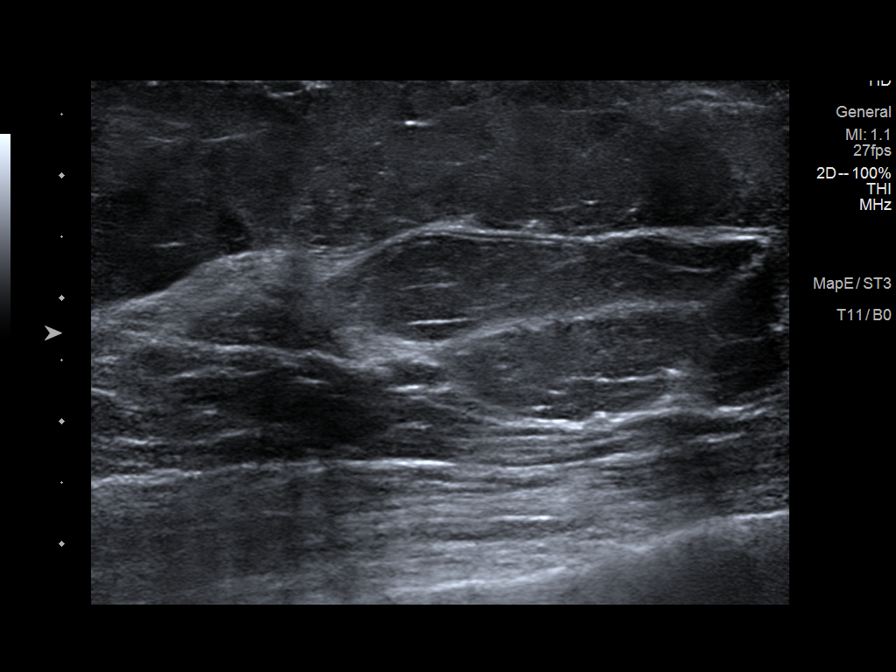
[im 4/7]
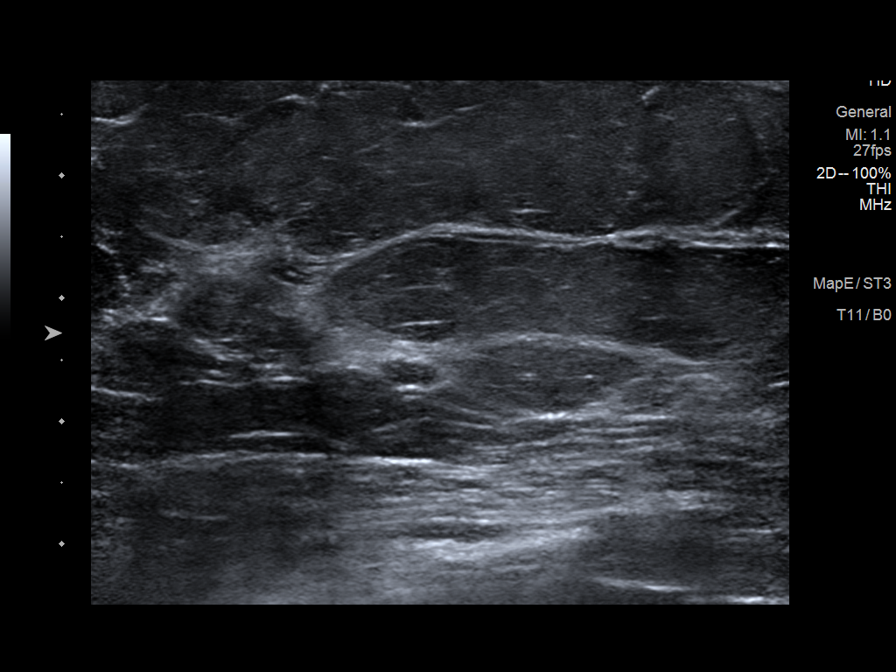
[im 5/7]
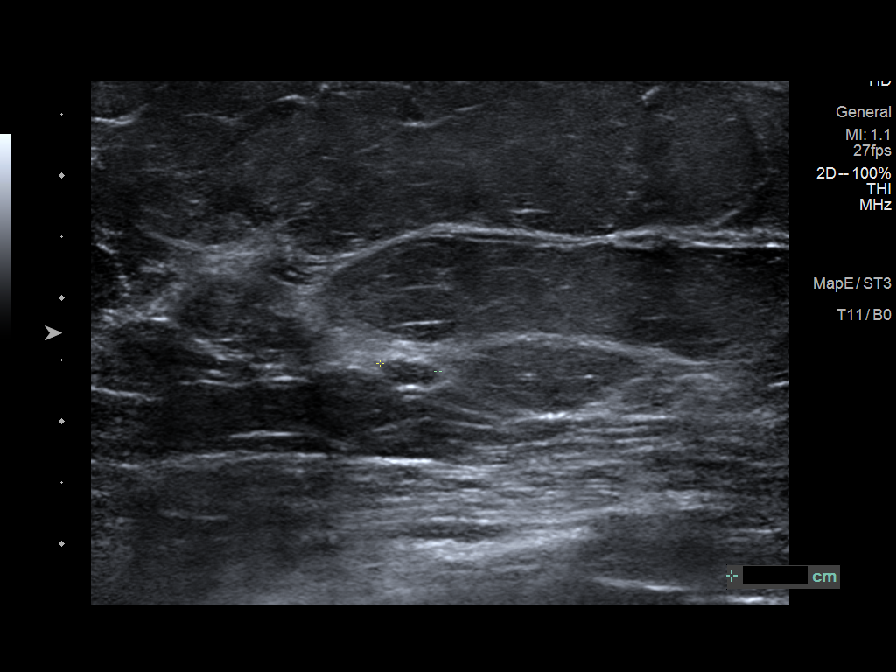
[im 6/7]
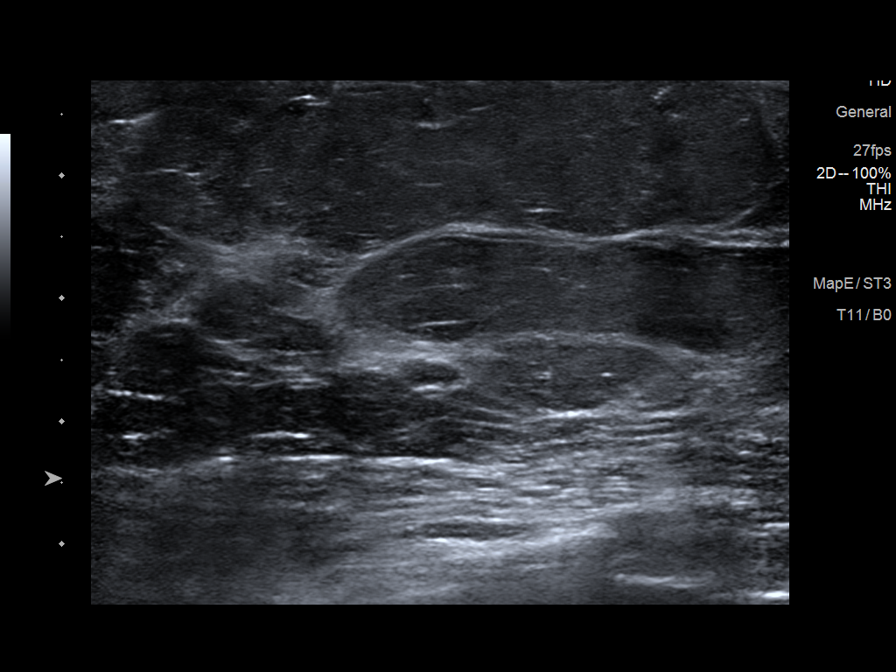
[im 7/7]
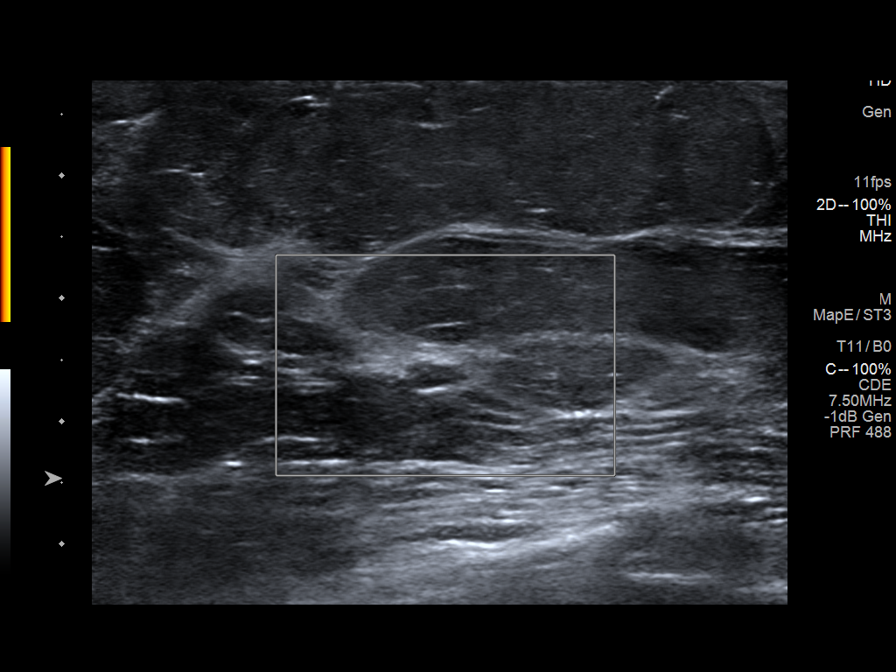

[7 of 7 positions shown; findings below may reference images not displayed]

ACR Breast Density Category b: There are scattered areas of
fibroglandular density.
FINDINGS: The previously described asymmetry in the superior left breast seen
in the MLO projection is less apparent on today's mammogram. No
mass, asymmetry, or suspicious microcalcification is identified in
the left breast to suggest malignancy. A small nodule in the deep
outer right breast appears mammographically stable. No new mass,
distortion, or suspicious microcalcification is seen in the right
breast.

Mammographic images were processed with CAD.

Targeted ultrasound is performed, showing an oval parallel
circumscribed hypoechoic mass at 9 o'clock position right breast 1
cm from the nipple measuring 0.6 x 0.2 x 0.5 cm. This is unchanged
in size compared to the ultrasound [DATE].
IMPRESSION: Stable probably benign nodule in the right breast at 9 o'clock
position 1 cm from the nipple, favored to be a complicated cyst. The
previously described left breast asymmetry is not apparent on
today's mammogram. There are no suspicious findings on the left.

RECOMMENDATION:
Right breast ultrasound is recommended in 6 months.

I have discussed the findings and recommendations with the patient.
Results were also provided in writing at the conclusion of the
visit. If applicable, a reminder letter will be sent to the patient
regarding the next appointment.

BI-RADS CATEGORY  3: Probably benign.

## 2017-08-08 MED FILL — FLUoxetine HCL 20 MG CAPS: 20 | 30 days supply | Qty: 30 | Fill #7

## 2017-08-08 MED FILL — NAPROXEN 500 MG TABLET: 500 | 15 days supply | Qty: 30 | Fill #4

## 2017-08-08 MED FILL — ?HYDROCHLOROTHIAZIDE 12.5MG: 12.5 | 30 days supply | Qty: 30 | Fill #7

## 2017-08-08 MED FILL — ?ATORVASTATIN 20 MG TABLET: 20 | 30 days supply | Qty: 30 | Fill #7

## 2017-09-10 MED FILL — ?HYDROCHLOROTHIAZIDE 12.5MG: 12.5 | 30 days supply | Qty: 30 | Fill #8

## 2017-09-10 MED FILL — ?FLUOXETINE HCL 20 MG CAP: 20 | 30 days supply | Qty: 30 | Fill #8

## 2017-09-10 MED FILL — ?ATORVASTATIN 20MG TABS: 20 | 30 days supply | Qty: 30 | Fill #8

## 2017-10-13 MED FILL — ?HYDROCHLOROTHIAZIDE 12.5MG: 12.5 | 30 days supply | Qty: 30 | Fill #9

## 2017-10-13 MED FILL — FLUoxetine HCL 20 MG CAPS: 20 | 30 days supply | Qty: 30 | Fill #9

## 2017-10-13 MED FILL — ATORVASTATIN 20 MG TABLET: 20 | 30 days supply | Qty: 30 | Fill #9

## 2017-11-18 MED FILL — ?HYDROCHLOROTHIAZIDE 12.5MG: 12.5 | 30 days supply | Qty: 30 | Fill #10

## 2017-11-18 MED FILL — FLUoxetine HCL 20 MG CAPS: 20 | 30 days supply | Qty: 30 | Fill #10

## 2017-11-18 MED FILL — ?ATORVASTATIN 20 MG TABLET: 20 | 30 days supply | Qty: 30 | Fill #10

## 2017-12-19 MED FILL — ATORVASTATIN 20 MG TABLET: 20 | 30 days supply | Qty: 30 | Fill #0

## 2017-12-25 MED FILL — ?FLUOXETINE HCL 20MG TABLET: 20 | 30 days supply | Qty: 30 | Fill #0

## 2017-12-25 MED FILL — HYDROCHLOROTHIAZIDE 12.5 MG: 12.5 | 30 days supply | Qty: 30 | Fill #0

## 2018-01-12 MED FILL — NAPROXEN 500 MG TABLET: 500 | 30 days supply | Qty: 60 | Fill #0

## 2018-01-12 MED FILL — CYCLOBENZAPRINE 10 MG TAB: 10 | 10 days supply | Qty: 30 | Fill #0

## 2018-02-06 MED FILL — HYDROCHLOROTHIAZIDE 12.5 MG: 12.5 | 30 days supply | Qty: 30 | Fill #0

## 2018-02-06 MED FILL — NAPROXEN 500 MG TABLET: 500 | 30 days supply | Qty: 60 | Fill #0

## 2018-02-06 MED FILL — FLUoxetine HCL 20 MG CAPS: 20 | 30 days supply | Qty: 30 | Fill #0

## 2018-02-17 MED FILL — ATORVASTATIN 20 MG TABLET: 20 | 30 days supply | Qty: 30 | Fill #0

## 2018-03-09 MED FILL — CYCLOBENZAPRINE 10 MG TAB: 10 | 10 days supply | Qty: 30 | Fill #1

## 2018-03-09 MED FILL — FLUoxetine HCL 20 MG CAPS: 20 | 30 days supply | Qty: 30 | Fill #1

## 2018-03-10 MED FILL — ?HYDROCHLOROTHIAZIDE 12.5MG: 12.5 | 30 days supply | Qty: 30 | Fill #0

## 2018-04-09 MED FILL — ?ATORVASTATIN 20 MG TABLET: 20 | 30 days supply | Qty: 30 | Fill #1

## 2018-04-09 MED FILL — FLUoxetine HCL 20 MG CAPS: 20 | 30 days supply | Qty: 30 | Fill #2

## 2018-04-09 MED FILL — ?HYDROCHLOROTHIAZIDE 12.5MG: 12.5 | 30 days supply | Qty: 30 | Fill #1

## 2018-04-30 IMAGING — US ULTRASOUND RIGHT BREAST LIMITED
1 series · 5 of 5 positions shown · non-contrast
Comparison: Previous exam(s).

CLINICAL DATA: Follow-up for probably benign right breast mass felt
to represent a complicated cyst.

EXAM:
ULTRASOUND OF THE RIGHT BREAST

[Series 1: ultrasound right breast limited · 0.07mm/px · 5 of 5 slices shown]
[im 1/5]
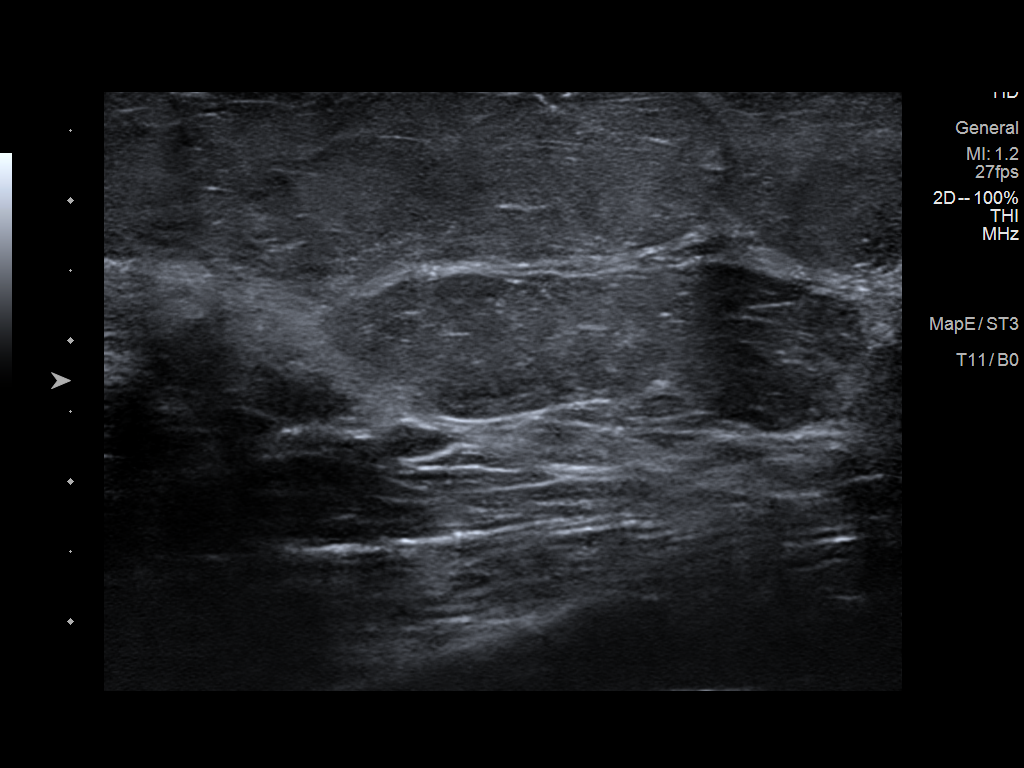
[im 2/5]
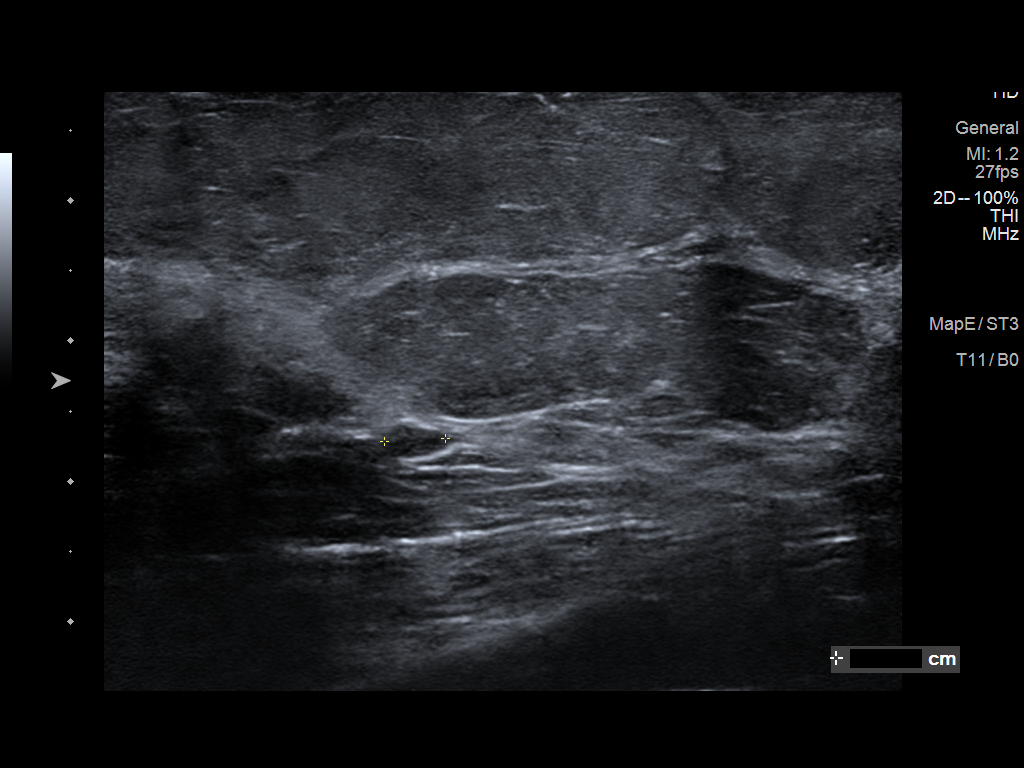
[im 3/5]
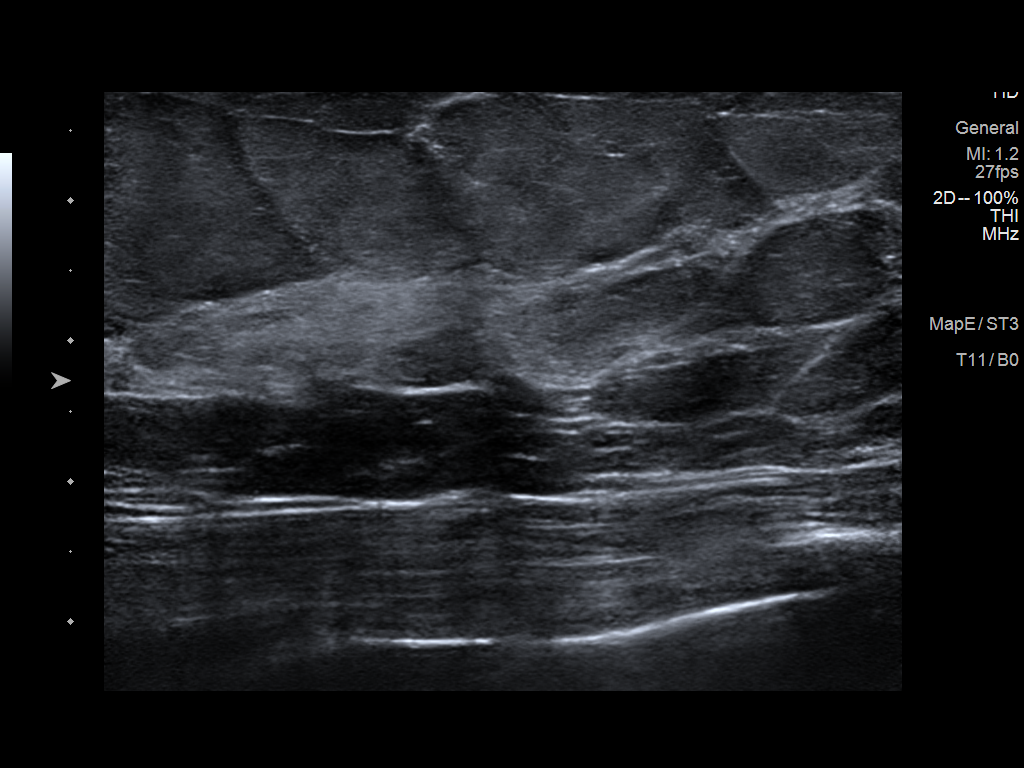
[im 4/5]
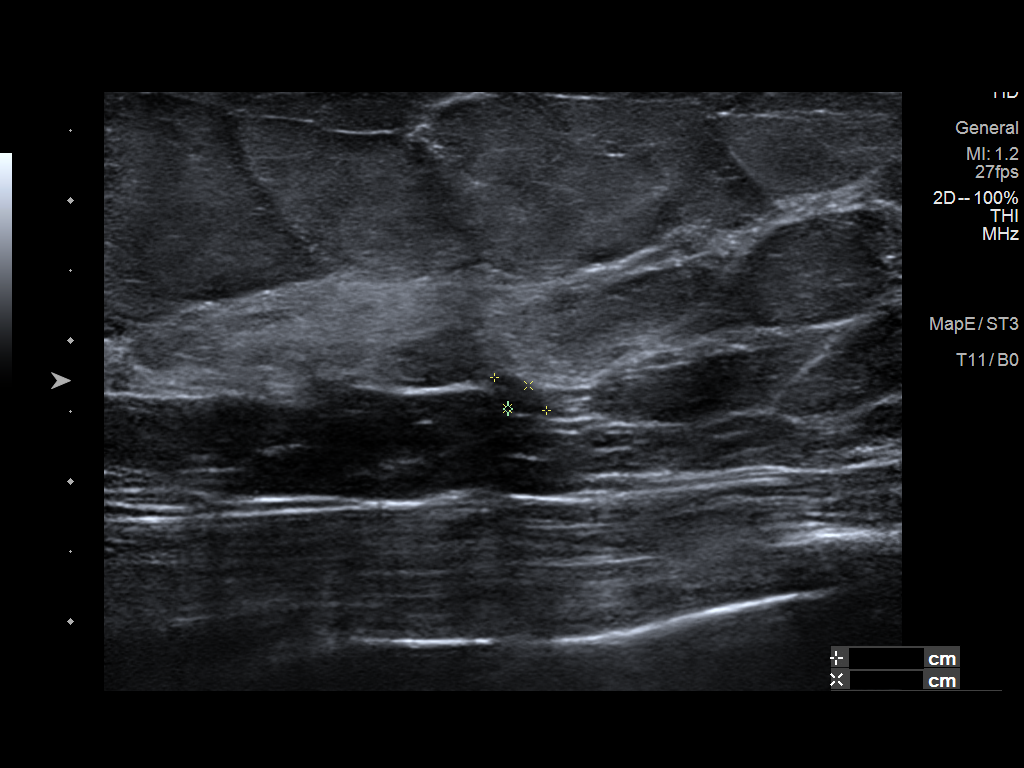
[im 5/5]
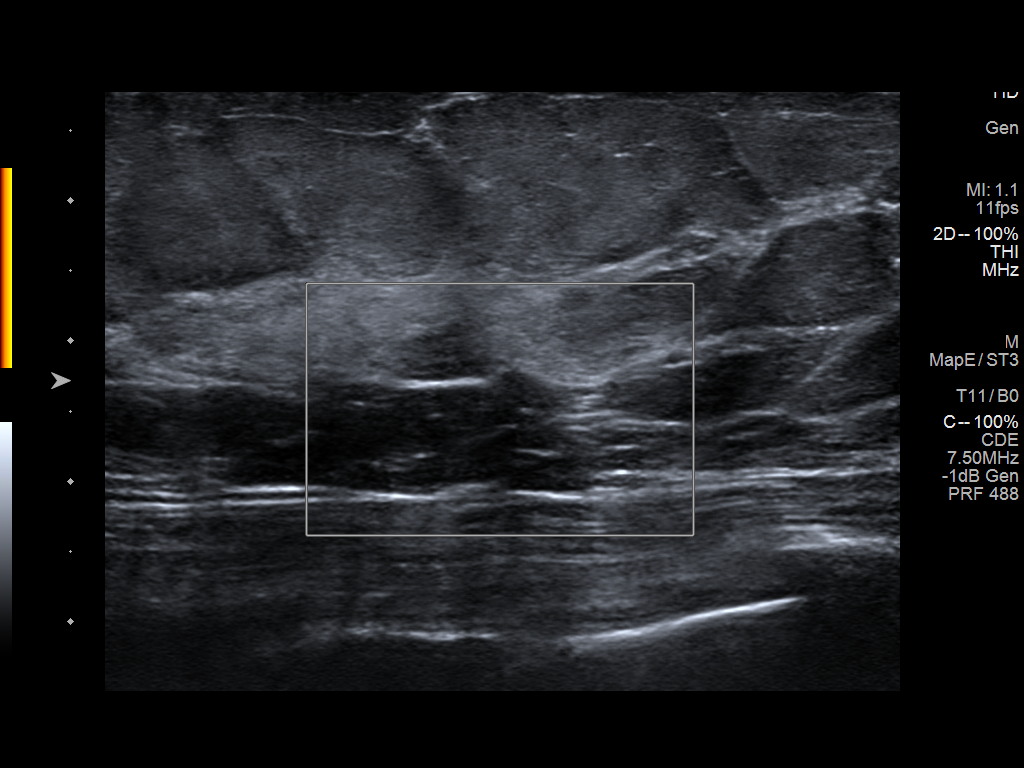

[5 of 5 positions shown; findings below may reference images not displayed]

FINDINGS: Targeted ultrasound of the right breast was performed. The oval
circumscribed mass in the right breast at 9 o'clock 1 cm from the
nipple measures 0.4 x 0.2 x 0.4 cm, overall unchanged given
differences in imaging and measurement technique.
IMPRESSION: Stable benign-appearing right breast mass.

RECOMMENDATION:
Bilateral diagnostic mammography [DATE] with possible right
breast ultrasound.

I have discussed the findings and recommendations with the patient.
Results were also provided in writing at the conclusion of the
visit. If applicable, a reminder letter will be sent to the patient
regarding the next appointment.

BI-RADS CATEGORY  3: Probably benign.

## 2018-05-13 MED FILL — ?ATORVASTATIN 20 MG TABLET: 20 | 30 days supply | Qty: 30 | Fill #2

## 2018-05-13 MED FILL — FLUoxetine HCL 20 MG CAPS: 20 | 30 days supply | Qty: 30 | Fill #3

## 2018-05-13 MED FILL — HYDROCHLOROTHIAZIDE 12.5 MG: 12.5 | 30 days supply | Qty: 30 | Fill #2

## 2018-06-09 MED FILL — FLUoxetine HCL 20 MG CAPS: 20 | 30 days supply | Qty: 30 | Fill #4

## 2018-06-09 MED FILL — ?ATORVASTATIN 20 MG TABLET: 20 | 30 days supply | Qty: 30 | Fill #3

## 2018-06-09 MED FILL — NAPROXEN 500 MG TABLET: 500 | 30 days supply | Qty: 60 | Fill #1

## 2018-06-11 MED FILL — HYDROCHLOROTHIAZIDE 12.5 MG: 12.5 | 30 days supply | Qty: 30 | Fill #0

## 2018-07-07 MED FILL — TOLTERODINE TART ER 2 MG CA: 2 | 30 days supply | Qty: 30 | Fill #0

## 2018-07-07 MED FILL — HYDROCHLOROTHIAZIDE 12.5 MG: 12.5 | 30 days supply | Qty: 30 | Fill #0

## 2018-07-13 MED FILL — FLUoxetine HCL 20 MG CAPS: 20 | 30 days supply | Qty: 30 | Fill #5

## 2018-07-13 MED FILL — ?ATORVASTATIN 20 MG TABLET: 20 | 30 days supply | Qty: 30 | Fill #4

## 2018-07-13 MED FILL — ?AMOXICILLIN 875 MG TABS: 875 | 5 days supply | Qty: 10 | Fill #0

## 2018-08-10 MED FILL — HYDROCHLOROTHIAZIDE 12.5 MG: 12.5 | 30 days supply | Qty: 30 | Fill #1

## 2018-08-10 MED FILL — TOLTERODINE TART ER 2 MG CA: 2 | 30 days supply | Qty: 30 | Fill #1

## 2018-08-10 MED FILL — ?ATORVASTATIN 20 MG TABLET: 20 | 30 days supply | Qty: 30 | Fill #5

## 2018-08-11 MED FILL — FLUoxetine HCL 20 MG CAPS: 20 | 90 days supply | Qty: 90 | Fill #0

## 2018-08-11 MED FILL — NAPROXEN 500 MG TABLET: 500 | 30 days supply | Qty: 60 | Fill #0

## 2018-09-08 MED FILL — ?ATORVASTATIN 20 MG TABLET: 20 | 90 days supply | Qty: 90 | Fill #6

## 2018-09-08 MED FILL — HYDROCHLOROTHIAZIDE 12.5 MG: 12.5 | 30 days supply | Qty: 30 | Fill #2

## 2018-09-17 MED FILL — ?AMOXICILLIN 875 MG TABS: 875 | 5 days supply | Qty: 10 | Fill #0

## 2018-10-05 MED FILL — HYDROCHLOROTHIAZIDE 12.5 MG: 12.5 | 30 days supply | Qty: 30 | Fill #3

## 2018-11-02 MED FILL — ?NAPROXEN 500 MG TABET: 500 | 30 days supply | Qty: 60 | Fill #1

## 2018-11-02 MED FILL — ?ATORVASTATIN 20 MG TABLET: 20 | 90 days supply | Qty: 90 | Fill #7

## 2018-11-02 MED FILL — HYDROCHLOROTHIAZIDE 12.5 MG: 12.5 | 30 days supply | Qty: 30 | Fill #4

## 2018-11-02 MED FILL — FLUoxetine HCL 20 MG CAPS: 20 | 30 days supply | Qty: 30 | Fill #0

## 2018-11-30 MED FILL — HYDROCHLOROTHIAZIDE 12.5 MG: 12.5 | 30 days supply | Qty: 30 | Fill #5

## 2018-11-30 MED FILL — FLUoxetine HCL 20 MG CAPS: 20 | 30 days supply | Qty: 30 | Fill #1

## 2018-12-29 MED FILL — FLUoxetine HCL 20 MG CAPS: 20 | 30 days supply | Qty: 30 | Fill #2

## 2019-01-19 MED FILL — HYDROCHLOROTHIAZIDE 12.5 MG: 12.5 | 30 days supply | Qty: 30 | Fill #0

## 2019-02-02 MED FILL — ?OMEPRAZOLE 20MG CAP DR: 20 | 90 days supply | Qty: 90 | Fill #0

## 2019-02-02 MED FILL — ?ATORVASTATIN 20 MG TABLET: 20 | 90 days supply | Qty: 90 | Fill #0

## 2019-02-02 MED FILL — FLUoxetine HCL 20 MG CAPS: 20 | 30 days supply | Qty: 30 | Fill #0

## 2019-02-18 MED FILL — ?HYDROCHLOROTHIAZIDE 12.5M: 12.5 | 30 days supply | Qty: 30 | Fill #0

## 2019-03-02 MED FILL — HYDROCHLOROTHIAZIDE 12.5 MG: 12.5 | 30 days supply | Qty: 30 | Fill #1

## 2019-03-02 MED FILL — FLUoxetine HCL 20 MG CAPS: 20 | 30 days supply | Qty: 30 | Fill #1

## 2019-03-29 MED FILL — ?ATORVASTATIN 20 MG TABLET: 20 | 90 days supply | Qty: 90 | Fill #1

## 2019-03-29 MED FILL — ?OMEPRAZOLE 20MG CAP DR: 20 | 90 days supply | Qty: 90 | Fill #1

## 2019-03-29 MED FILL — FLUoxetine HCL 20 MG CAPS: 20 | 30 days supply | Qty: 30 | Fill #2

## 2019-03-29 MED FILL — HYDROCHLOROTHIAZIDE 12.5 MG: 12.5 | 30 days supply | Qty: 30 | Fill #2

## 2019-04-26 MED FILL — ?HYDROCHLOROTHIAZIDE 12.5M: 12.5 | 30 days supply | Qty: 30 | Fill #3

## 2019-04-26 MED FILL — FLUoxetine HCL 20 MG CAPS: 20 | 30 days supply | Qty: 30 | Fill #3

## 2019-05-24 MED FILL — HYDROCHLOROTHIAZIDE 12.5 MG: 12.5 | 30 days supply | Qty: 30 | Fill #4

## 2019-05-24 MED FILL — FLUoxetine HCL 20 MG CAPS: 20 | 30 days supply | Qty: 30 | Fill #4

## 2019-06-22 MED FILL — FLUoxetine HCL 20 MG CAPS: 20 | 30 days supply | Qty: 30 | Fill #5

## 2019-06-22 MED FILL — HYDROCHLOROTHIAZIDE 12.5 MG: 12.5 | 30 days supply | Qty: 30 | Fill #5

## 2019-07-22 MED FILL — ?ATORVASTATIN 20 MG TABLET: 20 | 90 days supply | Qty: 90 | Fill #2

## 2019-07-23 MED FILL — FLUoxetine HCL 20 MG CAPS: 20 | 30 days supply | Qty: 30 | Fill #0

## 2019-07-23 MED FILL — HYDROCHLOROTHIAZIDE 12.5 MG: 12.5 | 30 days supply | Qty: 30 | Fill #0

## 2019-08-17 MED FILL — FLUoxetine HCL 20 MG CAPS: 20 | 30 days supply | Qty: 30 | Fill #1

## 2019-08-17 MED FILL — HYDROCHLOROTHIAZIDE 12.5 MG: 12.5 | 30 days supply | Qty: 30 | Fill #1

## 2019-09-29 MED FILL — OMEPRAZOLE 20 MG CAP: 20 | 90 days supply | Qty: 90 | Fill #0

## 2019-09-29 MED FILL — HYDROCHLOROTHIAZIDE 12.5 MG: 12.5 | 90 days supply | Qty: 90 | Fill #0

## 2019-09-29 MED FILL — FLUoxetine HCL 20 MG CAPS: 20 | 30 days supply | Qty: 30 | Fill #0

## 2019-09-29 MED FILL — DICLOFENAC SODIUM 1% GEL: 1 | 12 days supply | Qty: 100 | Fill #0

## 2019-11-10 MED FILL — FLUoxetine HCL 20 MG CAPS: 20 | 30 days supply | Qty: 30 | Fill #1

## 2019-11-10 MED FILL — ATORVASTATIN CALCIUM 20 MG: 20 | 30 days supply | Qty: 30 | Fill #3

## 2019-12-09 MED FILL — FLUoxetine HCL 20 MG CAPS: 20 | 30 days supply | Qty: 30 | Fill #2

## 2019-12-10 MED FILL — ?ATORVASTATIN 20 MG TABLET: 20 | 30 days supply | Qty: 30 | Fill #4

## 2020-01-17 MED FILL — FLUoxetine HCL 20 MG CAPS: 20 | 30 days supply | Qty: 30 | Fill #3

## 2020-01-17 MED FILL — ?ATORVASTATIN 20 MG TABLET: 20 | 30 days supply | Qty: 30 | Fill #5

## 2020-01-17 MED FILL — HYDROCHLOROTHIAZIDE 12.5 MG: 12.5 | 30 days supply | Qty: 30 | Fill #0

## 2020-02-23 MED FILL — HYDROCHLOROTHIAZIDE 12.5 MG: 12.5 | 30 days supply | Qty: 30 | Fill #1

## 2020-02-23 MED FILL — ATORVASTATIN CALCIUM 20 MG: 20 | 30 days supply | Qty: 30 | Fill #0

## 2020-02-23 MED FILL — FLUoxetine HCL 20 MG CAPS: 20 | 30 days supply | Qty: 30 | Fill #4

## 2020-03-20 MED FILL — FLUoxetine HCL 20 MG CAPS: 20 | 30 days supply | Qty: 30 | Fill #5

## 2020-03-20 MED FILL — ATORVASTATIN CALCIUM 20 MG: 20 | 30 days supply | Qty: 30 | Fill #1

## 2020-03-20 MED FILL — HYDROCHLOROTHIAZIDE 12.5 MG: 12.5 | 30 days supply | Qty: 30 | Fill #2

## 2020-05-03 MED FILL — FLUoxetine HCL 20 MG CAPS: 20 | 30 days supply | Qty: 30 | Fill #0

## 2020-05-03 MED FILL — ?ATORVASTATIN 20 MG TABLET: 20 | 30 days supply | Qty: 30 | Fill #2

## 2020-05-03 MED FILL — HYDROCHLOROTHIAZIDE 12.5 MG: 12.5 | 30 days supply | Qty: 30 | Fill #0

## 2020-06-07 MED FILL — ?ATORVASTATIN 20 MG TABLET: 20 | 30 days supply | Qty: 30 | Fill #3

## 2020-06-14 MED FILL — FLUoxetine HCL 20 MG CAPS: 20 | 90 days supply | Qty: 90 | Fill #0

## 2020-06-14 MED FILL — LISINOPRIL-HYDROCHLOROTHIAZ: 20-25 | 30 days supply | Qty: 30 | Fill #0

## 2020-06-14 MED FILL — OMEPRAZOLE 20 MG CAP: 20 | 90 days supply | Qty: 90 | Fill #0

## 2020-07-11 MED FILL — ?ATORVASTATIN 20 MG TABLET: 20 | 30 days supply | Qty: 30 | Fill #4

## 2020-07-11 MED FILL — LISINOPRIL-HYDROCHLOROTHIAZ: 20-25 | 30 days supply | Qty: 30 | Fill #1

## 2020-08-09 IMAGING — DX DG KNEE COMPLETE 4+V*R*
4 series · 4 of 4 positions shown · non-contrast
Comparison: None.

CLINICAL DATA: Pain

EXAM:
RIGHT KNEE - COMPLETE 4+ VIEW

[knee standing ap]
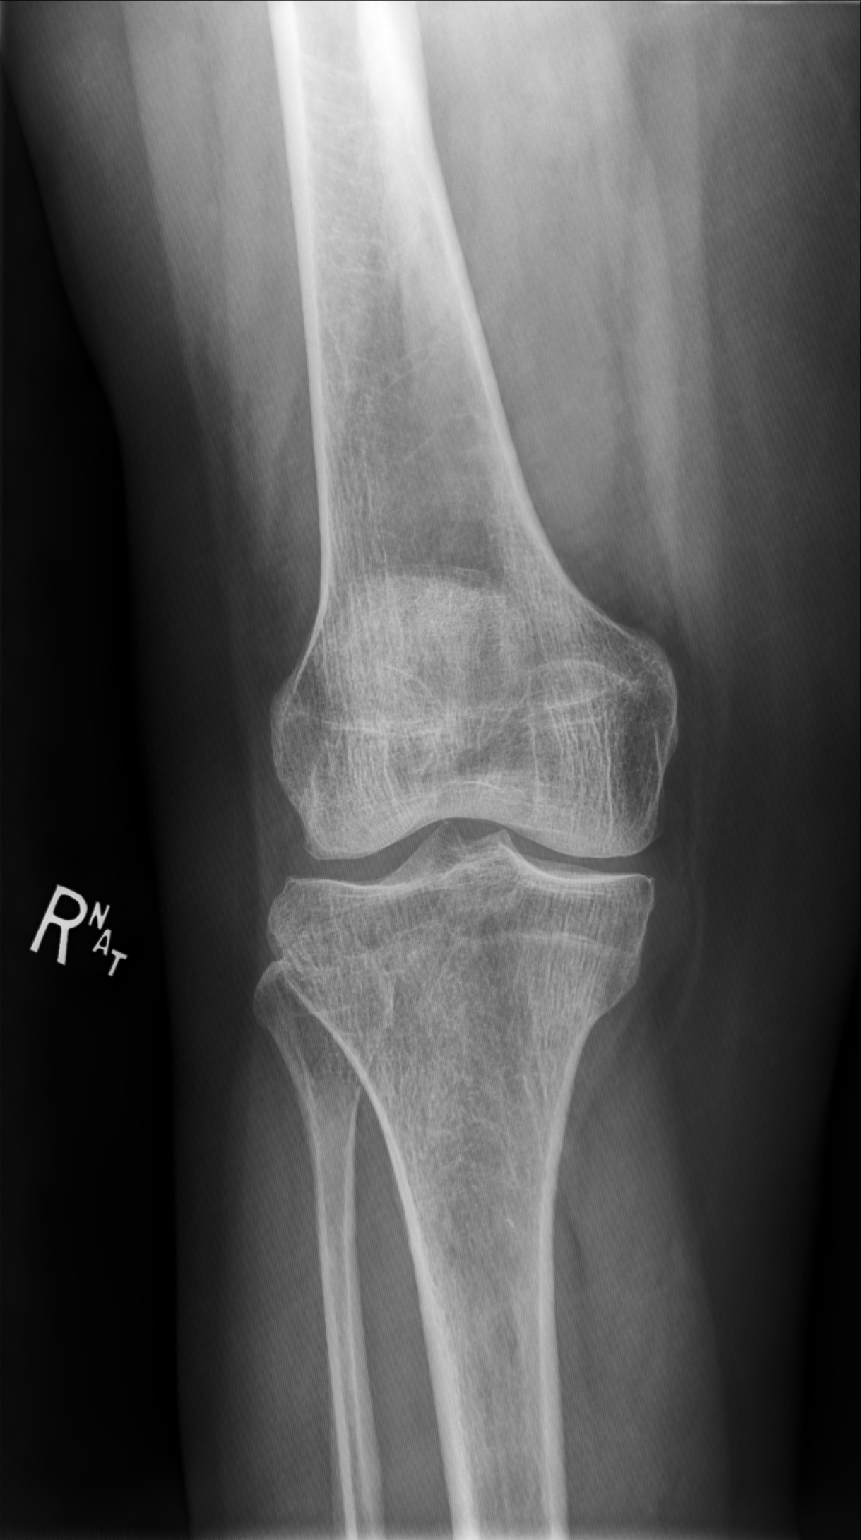

[knee lmo]
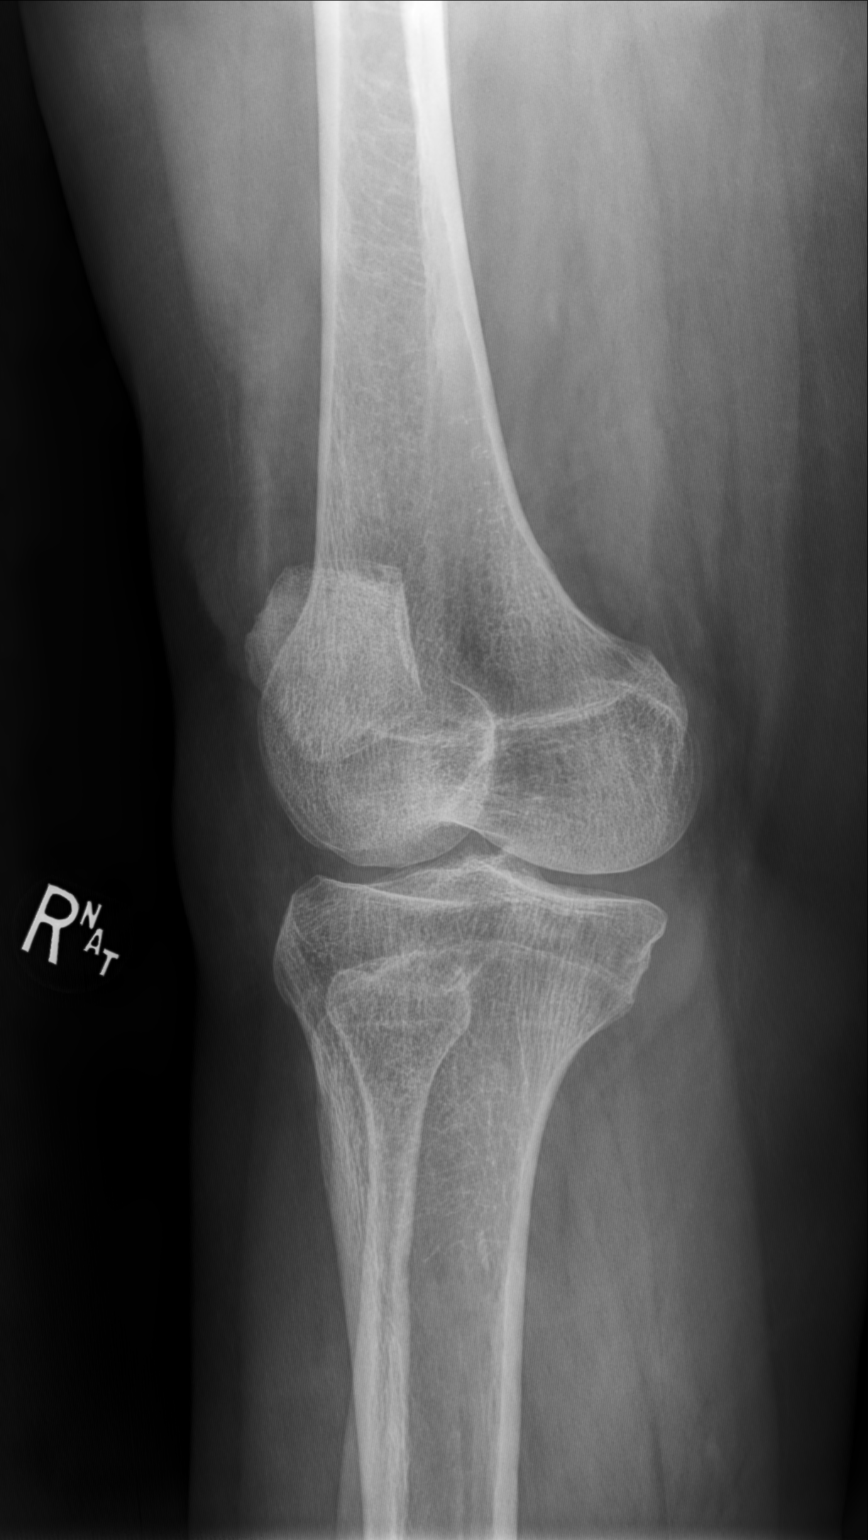

[knee mlo]
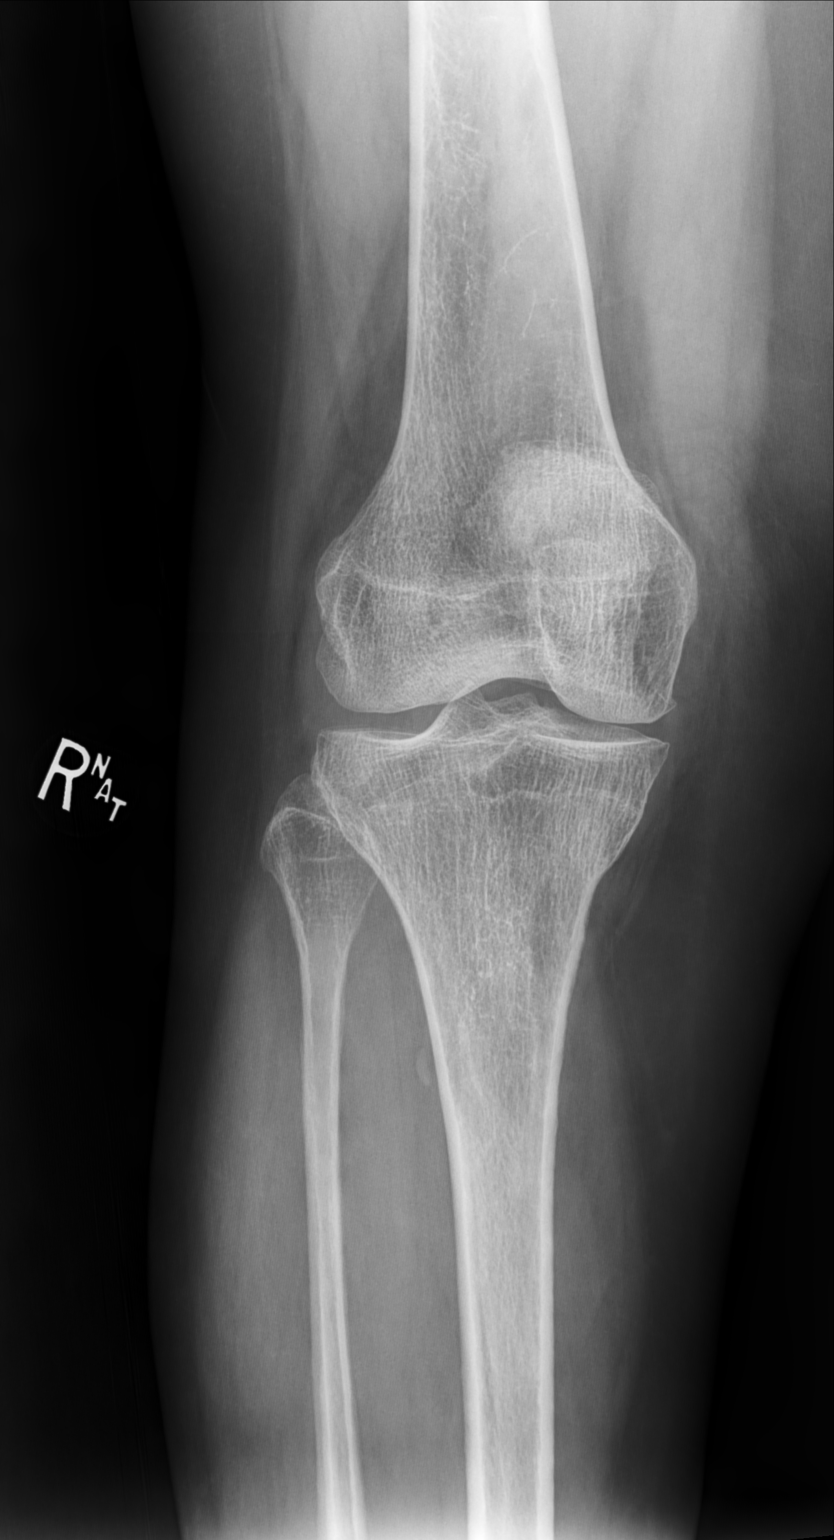

[knee standing lat]
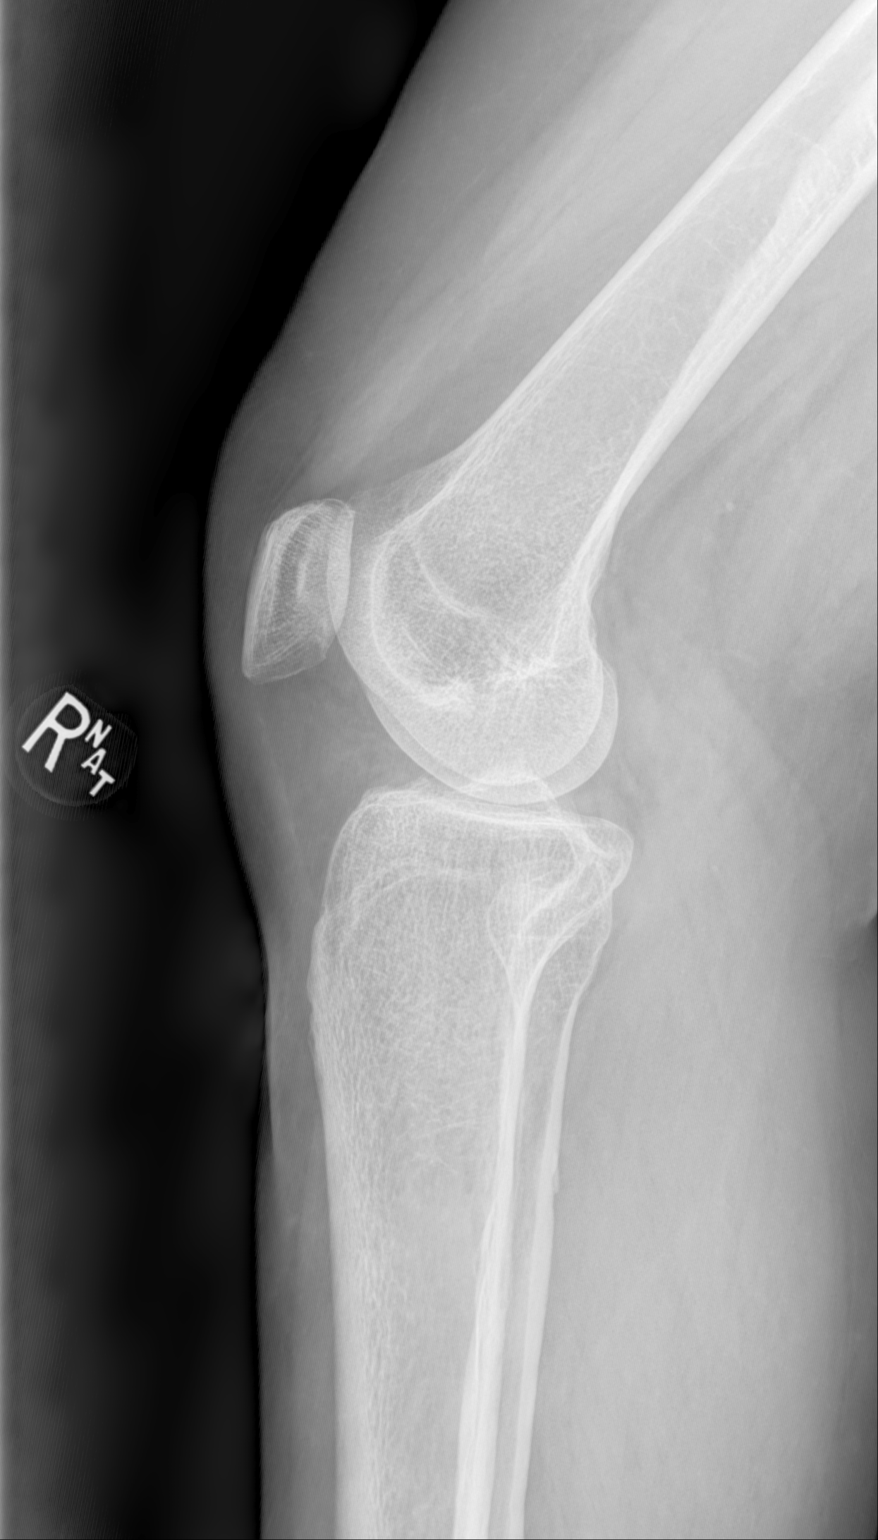

[4 of 4 positions shown; findings below may reference images not displayed]

FINDINGS: Frontal, lateral, and bilateral oblique views were obtained. No
fracture, dislocation, or joint effusion. Joint spaces appear
normal. No erosive change.
IMPRESSION: No fracture, dislocation, or joint effusion. No evident arthropathy.

## 2020-08-14 MED FILL — HYDROCHLOROTHIAZIDE 25 MG T: 25 | 30 days supply | Qty: 30 | Fill #0

## 2020-08-14 MED FILL — AMLODIPINE BESYLATE 5 MG TA: 5 | 30 days supply | Qty: 30 | Fill #0

## 2020-08-16 MED FILL — MELOXICAM 7.5 MG TABLET: 7.5 | 30 days supply | Qty: 30 | Fill #0

## 2021-03-21 IMAGING — MR MR KNEE*R* W/O CM
4 of 7 series · 22 of 40 positions shown · non-contrast
Comparison: X-ray knee [DATE].

CLINICAL DATA: Patient complains of right medial anterior knee pain
for 8 months. Patient denies history of surgery or cancer. Assess
meniscal tear.

EXAM:
MRI OF THE RIGHT KNEE WITHOUT CONTRAST
TECHNIQUE: Multiplanar, multisequence MR imaging of the knee was performed. No
intravenous contrast was administered.

[Series 4: T2 fat-sat · coronal · 4.0mm · 0.59mm/px · 5 of 21 slices shown (1 of 2)]
[im 1/21]
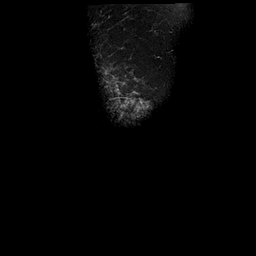
[im 6/21]
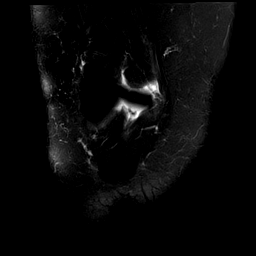
[im 11/21]
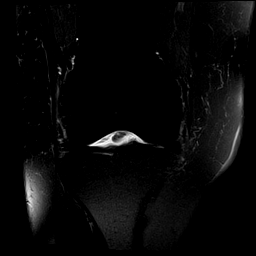
[im 16/21]
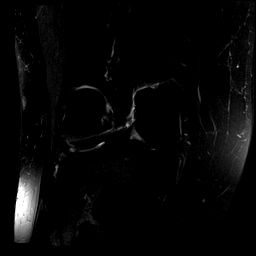
[im 21/21]
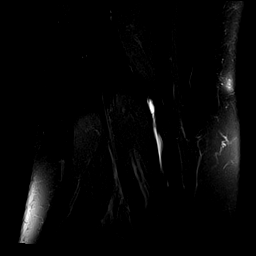

[Series 5: T1 · coronal · 4.0mm · 0.29mm/px · 3 of 21 slices shown]
[im 1/21]
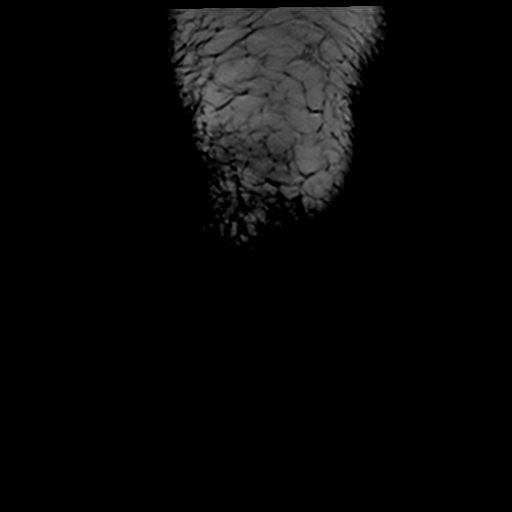
[im 11/21]
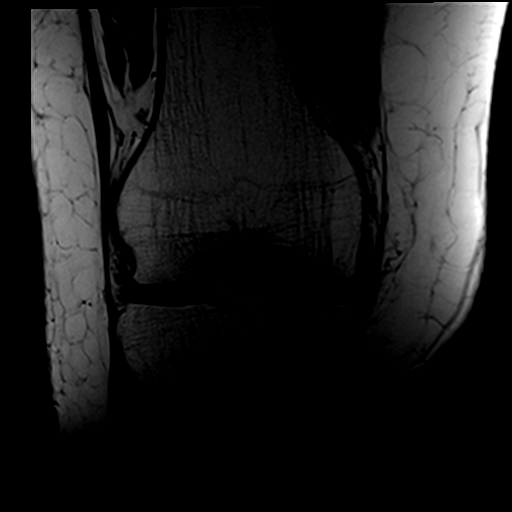
[im 21/21]
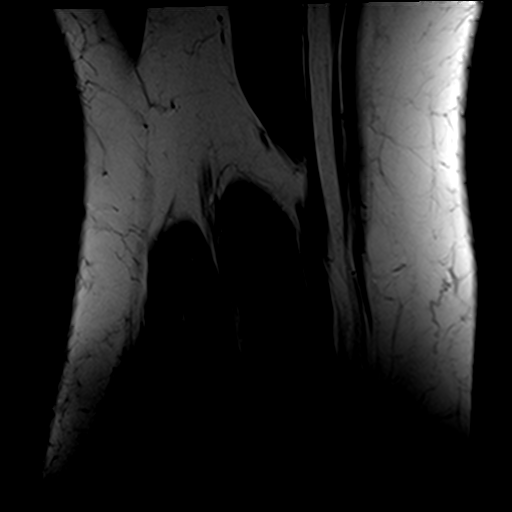

[Series 7: T2 fat-sat · sagittal · 3.0mm · 0.29mm/px · 7 of 27 slices shown (2 of 2)]
[im 1/27]
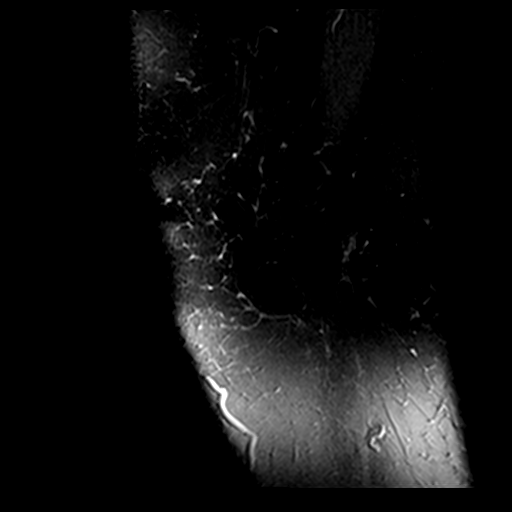
[im 5/27]
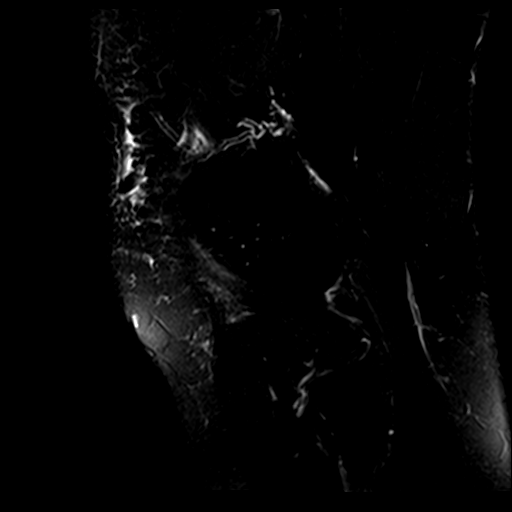
[im 9/27]
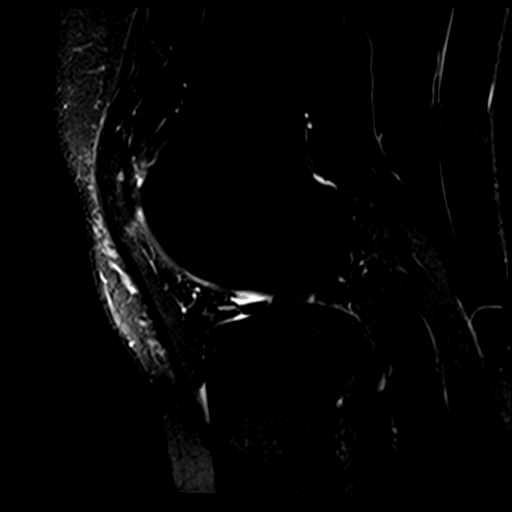
[im 14/27]
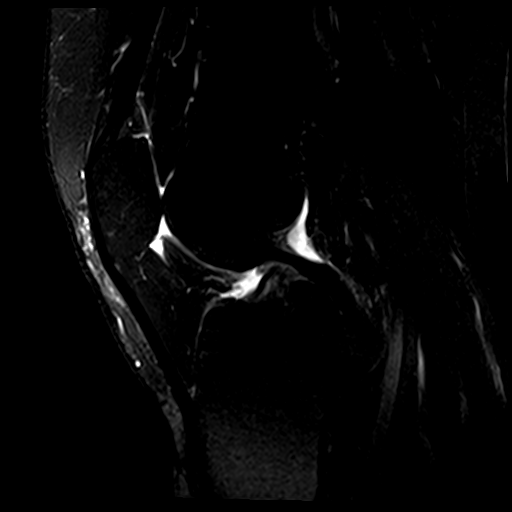
[im 18/27]
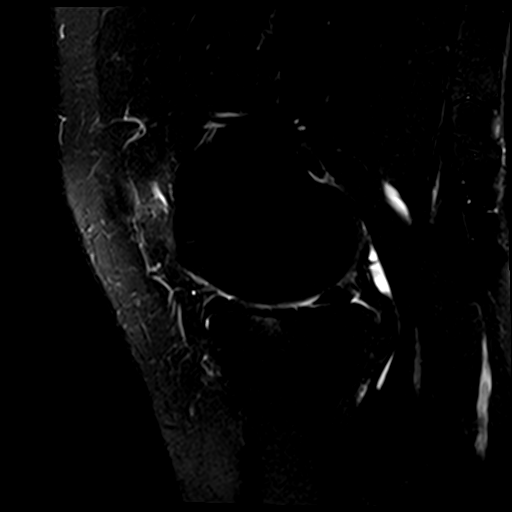
[im 22/27]
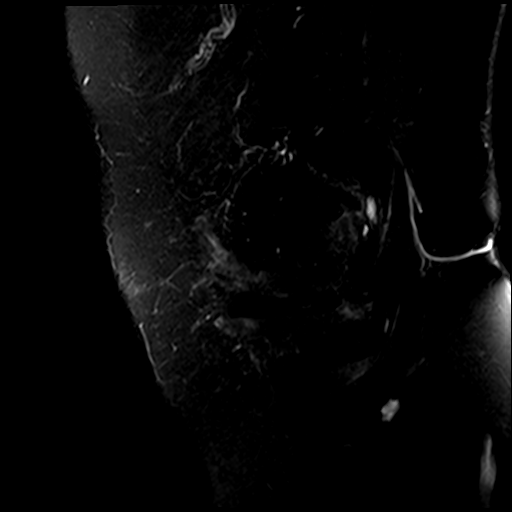
[im 27/27]
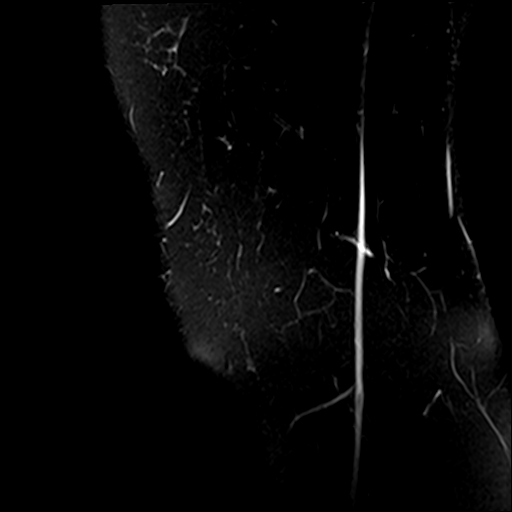

[Series 8: PD fat-sat · sagittal · 3.0mm · 0.29mm/px · 7 of 27 slices shown]
[im 1/27]
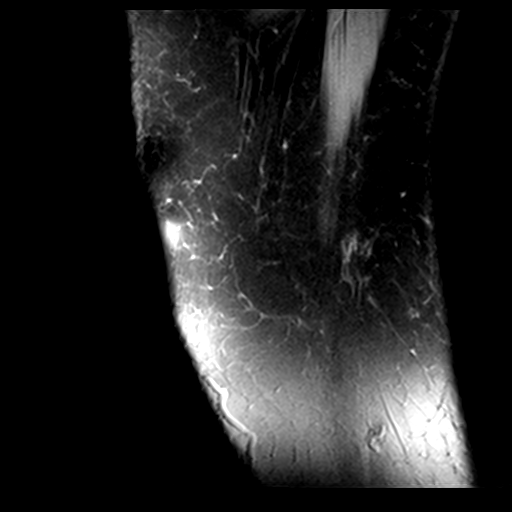
[im 5/27]
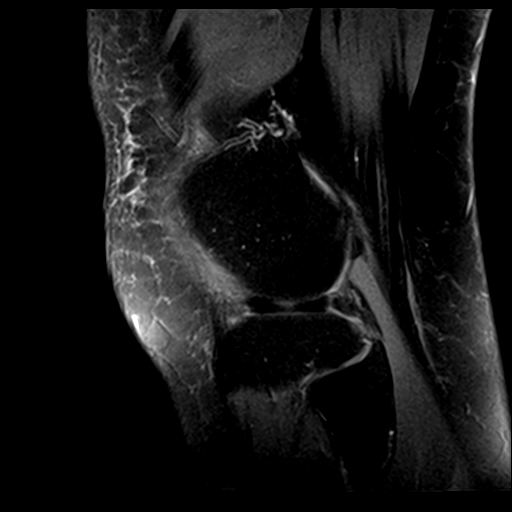
[im 9/27]
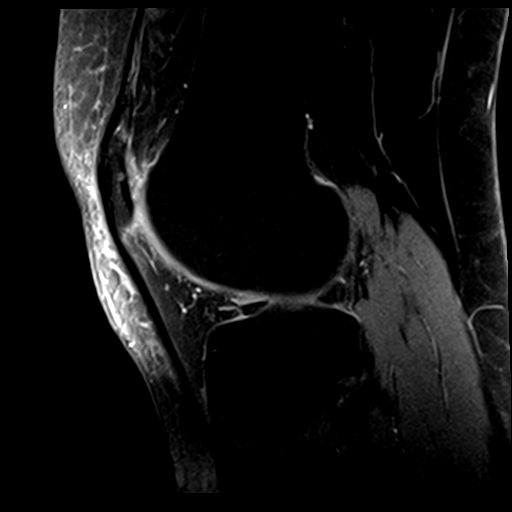
[im 14/27]
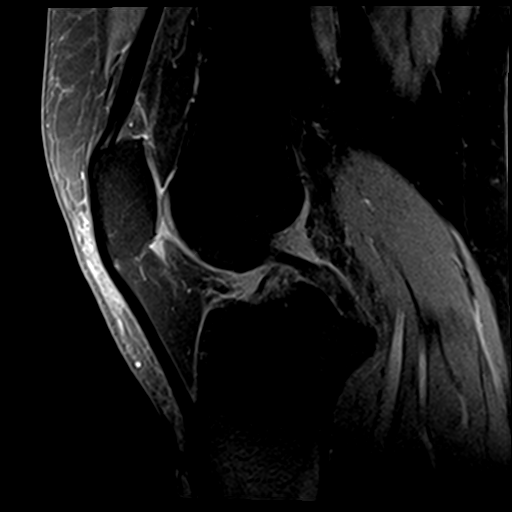
[im 18/27]
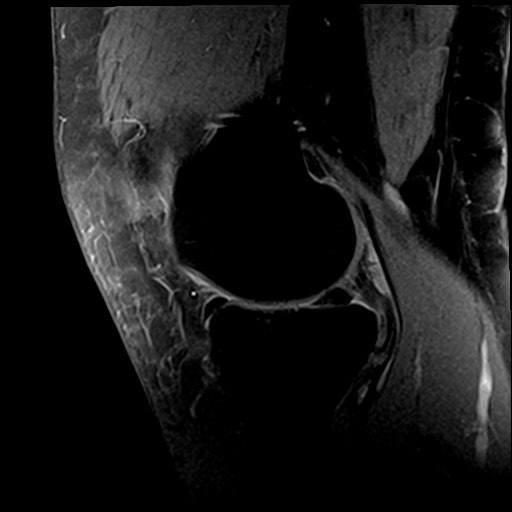
[im 22/27]
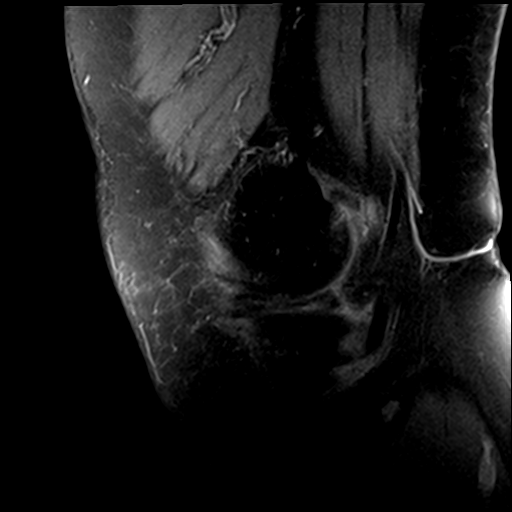
[im 27/27]
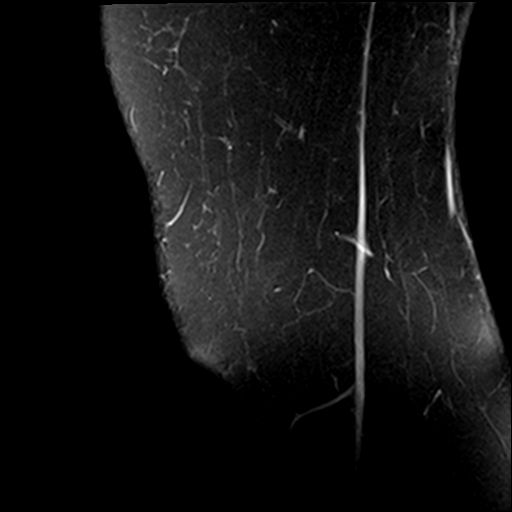

[22 of 40 positions shown; findings below may reference images not displayed]

FINDINGS: MENISCI

Medial: Oblique tear of the posterior horn-body junction of the
medial meniscus with extension to the inferior articular surface.

Lateral: Intact.

LIGAMENTS

Cruciates: ACL and PCL are intact.

Collaterals: Medial collateral ligament is intact. Lateral
collateral ligament complex is intact.

CARTILAGE

Patellofemoral: Cartilage fissuring of the lateral patellar facet
and mild partial-thickness cartilage loss throughout the patella.
Partial-thickness cartilage loss of the lateral trochlea.

Medial: Partial-thickness cartilage loss of the medial femorotibial
compartment.

Lateral:  No chondral defect.

JOINT: No joint effusion. Normal NATY. No plical
thickening.

POPLITEAL FOSSA: Popliteus tendon is intact. No Baker's cyst.

EXTENSOR MECHANISM: Intact quadriceps tendon. Intact patellar
tendon. Intact lateral patellar retinaculum. Intact medial patellar
retinaculum. Intact MPFL.

BONES: No aggressive osseous lesion. No fracture or dislocation.

Other: No fluid collection or hematoma. Muscles are normal.
IMPRESSION: 1. Oblique tear of the posterior horn-body junction of the medial
meniscus with extension to the inferior articular surface.
2. Cartilage abnormalities of the patellofemoral compartment and
medial femorotibial compartment as described above.

## 2021-08-03 IMAGING — MR MR KNEE*L* W/O CM
4 of 7 series · 22 of 40 positions shown · non-contrast
Comparison: X-ray [DATE]

CLINICAL DATA: Left knee pain for 5 months.  No known injury.

EXAM:
MRI OF THE LEFT KNEE WITHOUT CONTRAST
TECHNIQUE: Multiplanar, multisequence MR imaging of the knee was performed. No
intravenous contrast was administered.

[Series 3: T2 fat-sat · axial · 4.0mm · 0.70mm/px · z∈[-68,+57]mm · 5 of 26 slices shown]
[im 1/26]
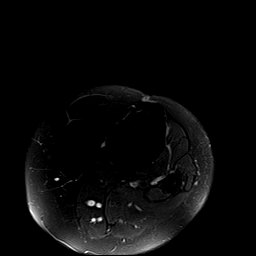
[im 7/26]
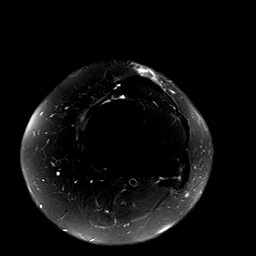
[im 13/26]
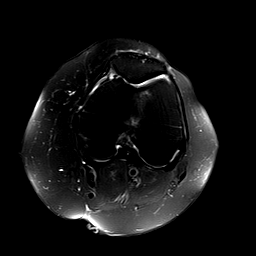
[im 19/26]
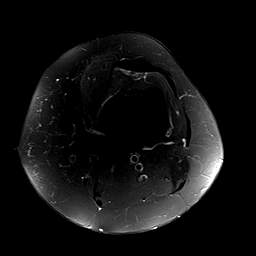
[im 26/26]
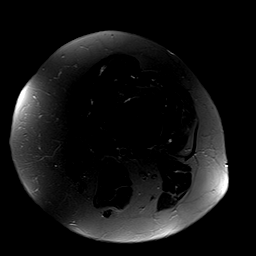

[Series 6: PD fat-sat · coronal · 3.5mm · 0.31mm/px · 7 of 29 slices shown (1 of 3)]
[im 1/29]
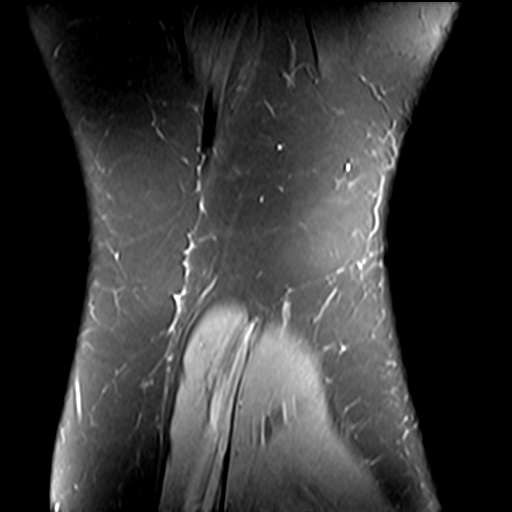
[im 5/29]
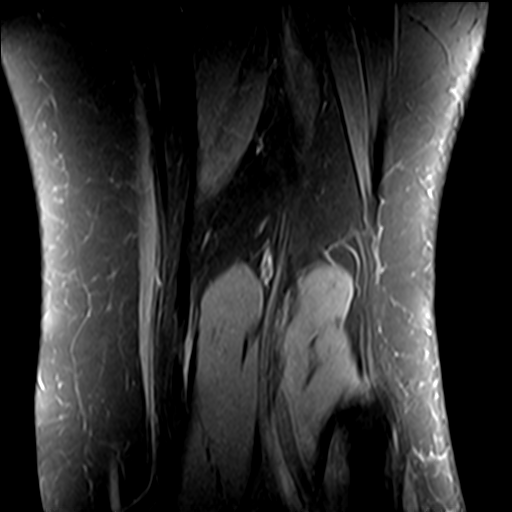
[im 10/29]
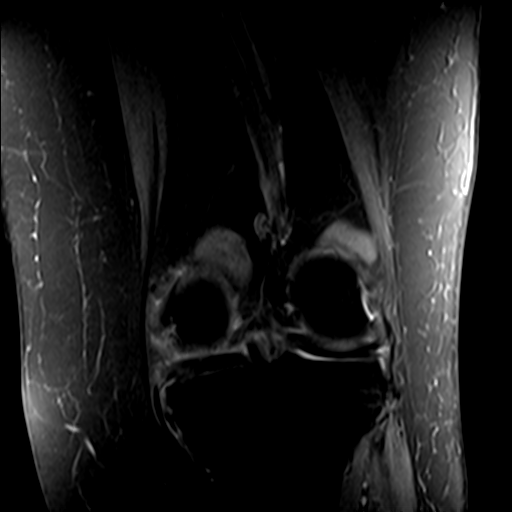
[im 15/29]
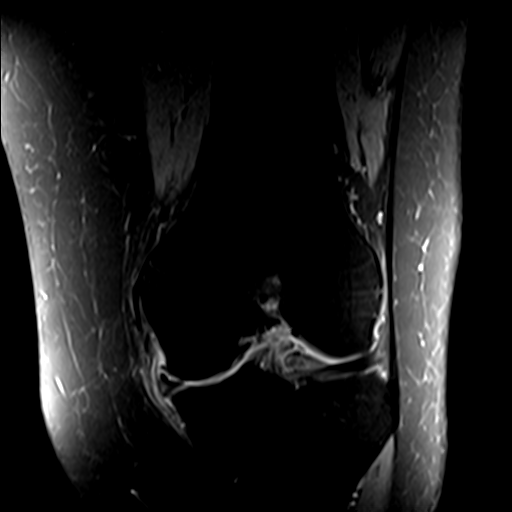
[im 19/29]
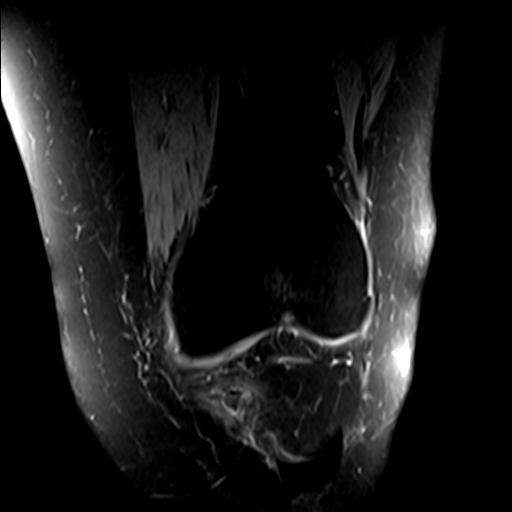
[im 24/29]
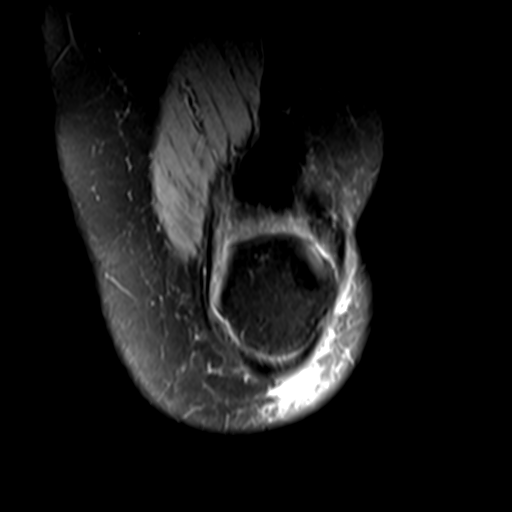
[im 29/29]
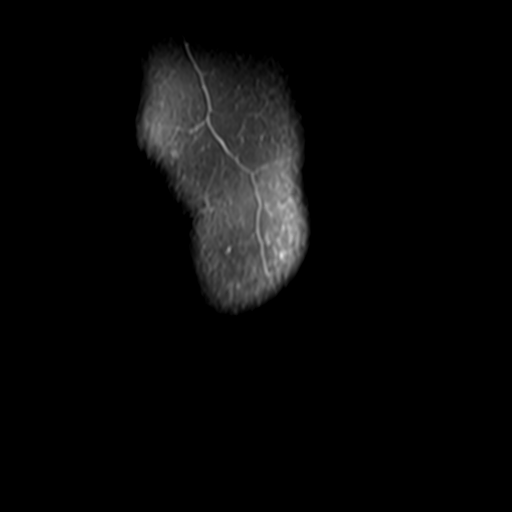

[Series 8: PD fat-sat · sagittal · 3.0mm · 0.31mm/px · 7 of 29 slices shown (2 of 3)]
[im 1/29]
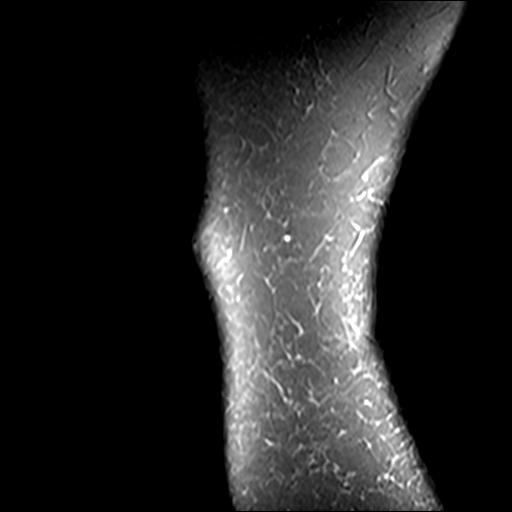
[im 5/29]
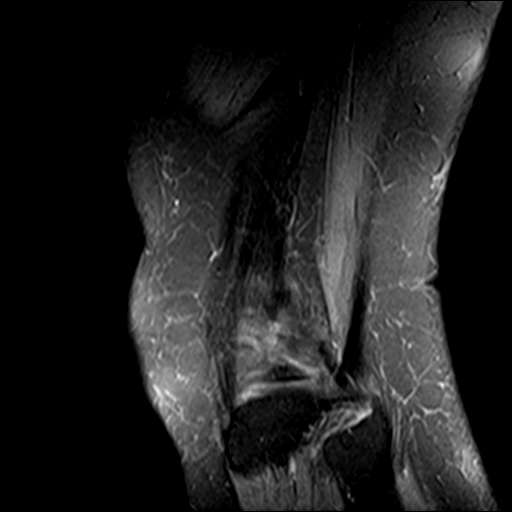
[im 10/29]
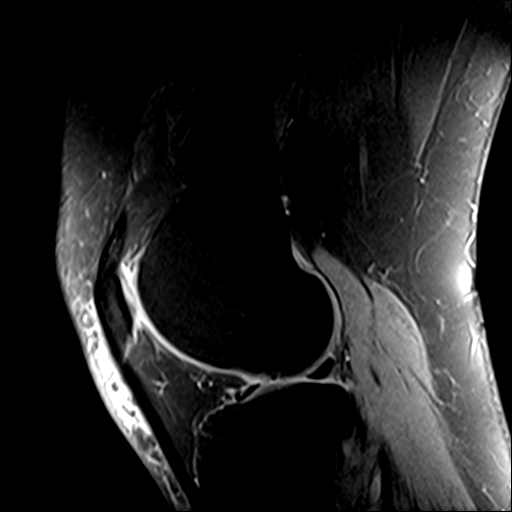
[im 15/29]
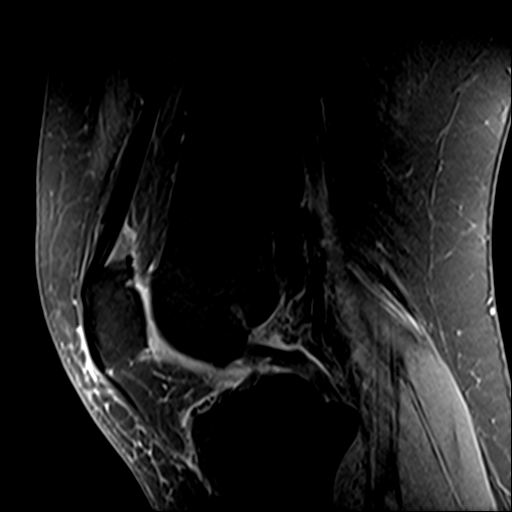
[im 19/29]
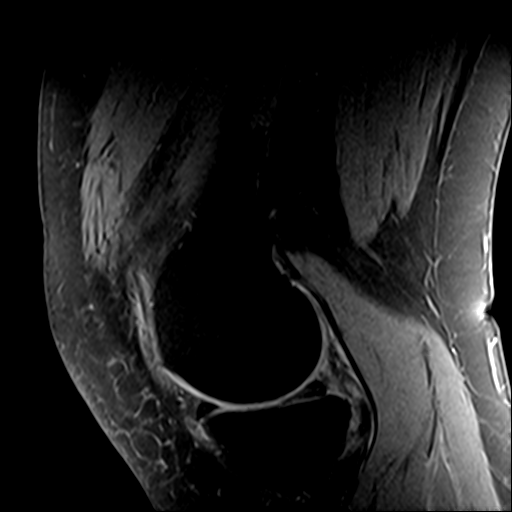
[im 24/29]
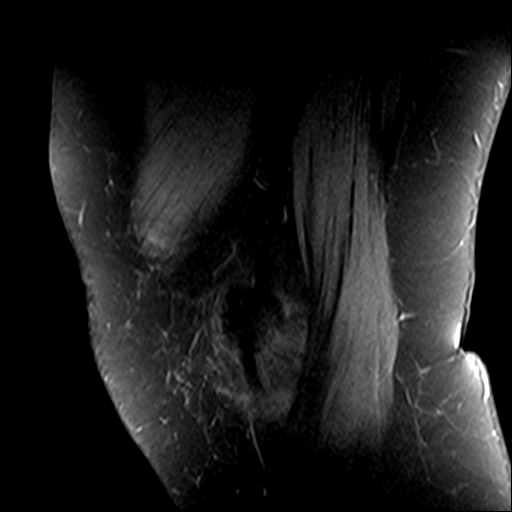
[im 29/29]
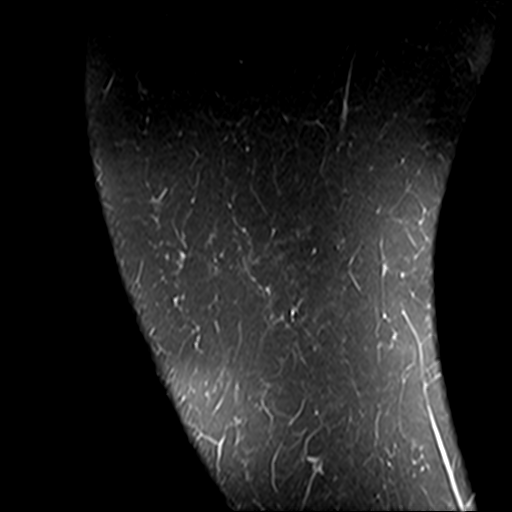

[Series 9: PD fat-sat · coronal · 2.3mm · 0.29mm/px · 3 of 13 slices shown (3 of 3)]
[im 1/13]
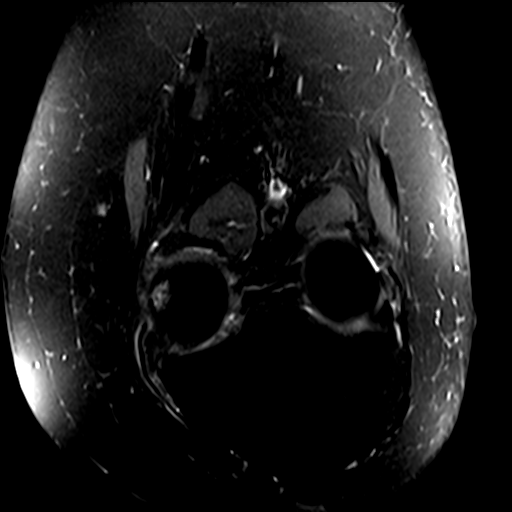
[im 7/13]
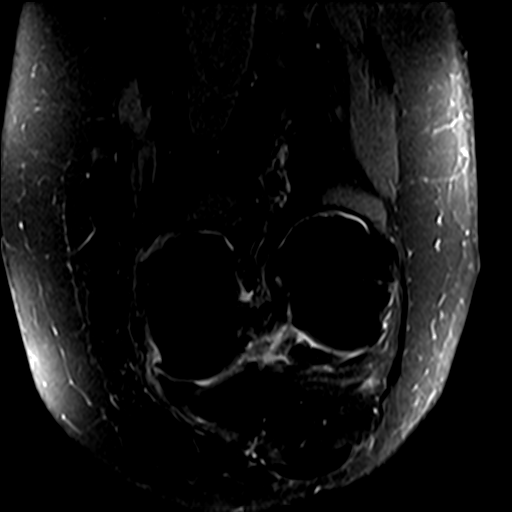
[im 13/13]
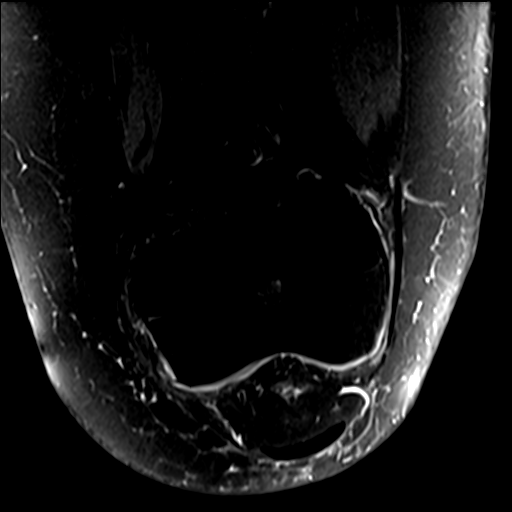

[22 of 40 positions shown; findings below may reference images not displayed]

FINDINGS: MENISCI

Medial meniscus: Intrasubstance degeneration with oblique tear of
the medial meniscus centered at the posterior horn-body junction
with extension to the inferior articular surface.

Lateral meniscus:  Intact.

LIGAMENTS

Cruciates: Intact ACL and PCL.

Collaterals: Intact MCL with mild periligamentous edema, which may
be reactive or reflect a grade 1 sprain. Lateral collateral ligament
complex intact.

CARTILAGE

Patellofemoral: Cartilage loss with full-thickness fissuring of the
medial patellar facet and lateral trochlea.

Medial: Mild chondral thinning of the weight-bearing medial
compartment.

Lateral: Mild chondral thinning of the weight-bearing lateral
compartment.

MISCELLANEOUS

Joint:  No joint effusion. Fat pads within normal limits.

Popliteal Fossa:  No Baker's cyst. Intact popliteus tendon.

Extensor Mechanism:  Intact quadriceps and patellar tendons.

Bones: No acute fracture. No dislocation. Tricompartmental joint
space narrowing with small marginal osteophytes. Reactive
subchondral marrow edema within the patellofemoral compartment. No
marrow replacing bone lesion.

Other: Mild prepatellar subcutaneous edema.
IMPRESSION: 1. Tricompartmental osteoarthritis, most pronounced in the
patellofemoral compartment.
2. Medial meniscal degeneration and tearing.
3. Intact MCL with mild periligamentous edema, which may be reactive
or reflect a grade 1 sprain.
